# Patient Record
Sex: Female | Born: 1990 | Race: Asian | Hispanic: No | Marital: Single | Smoking: Former smoker
Health system: Southern US, Community
[De-identification: ages and names within clinical notes are randomized; demographics above are authoritative.]

## PROBLEM LIST (undated history)

## (undated) DIAGNOSIS — Z789 Other specified health status: Secondary | ICD-10-CM

## (undated) HISTORY — DX: Other specified health status: Z78.9

## (undated) HISTORY — PX: WISDOM TOOTH EXTRACTION: SHX21

## (undated) HISTORY — PX: NO PAST SURGERIES: SHX2092

---

## 2010-07-13 ENCOUNTER — Emergency Department (HOSPITAL_COMMUNITY)
Admission: EM | Admit: 2010-07-13 | Discharge: 2010-07-13 | Disposition: A | Discharge status: Home / Self Care | Payer: Self-pay | Attending: Emergency Medicine | Admitting: Emergency Medicine

## 2010-07-13 ENCOUNTER — Emergency Department (HOSPITAL_COMMUNITY): Payer: Self-pay

## 2010-07-13 DIAGNOSIS — S0100XA Unspecified open wound of scalp, initial encounter: Secondary | ICD-10-CM | POA: Insufficient documentation

## 2010-07-13 DIAGNOSIS — Z23 Encounter for immunization: Secondary | ICD-10-CM | POA: Insufficient documentation

## 2010-07-13 DIAGNOSIS — S0083XA Contusion of other part of head, initial encounter: Secondary | ICD-10-CM | POA: Insufficient documentation

## 2010-07-13 DIAGNOSIS — S0003XA Contusion of scalp, initial encounter: Secondary | ICD-10-CM | POA: Insufficient documentation

## 2010-07-13 DIAGNOSIS — Y9229 Other specified public building as the place of occurrence of the external cause: Secondary | ICD-10-CM | POA: Insufficient documentation

## 2010-07-13 LAB — GLUCOSE, CAPILLARY: Glucose-Capillary: 100 mg/dL — ABNORMAL HIGH (ref 70–99)

## 2010-07-13 LAB — POCT I-STAT, CHEM 8
Calcium, Ion: 1.04 mmol/L — ABNORMAL LOW (ref 1.12–1.32)
Glucose, Bld: 95 mg/dL (ref 70–99)
HCT: 43 % (ref 36.0–46.0)
Hemoglobin: 14.6 g/dL (ref 12.0–15.0)
TCO2: 24 mmol/L (ref 0–100)
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
## 2013-12-23 ENCOUNTER — Ambulatory Visit (INDEPENDENT_AMBULATORY_CARE_PROVIDER_SITE_OTHER): Payer: Medicaid Other | Admitting: Adult Health

## 2013-12-23 ENCOUNTER — Encounter: Payer: Self-pay | Admitting: Adult Health

## 2013-12-23 VITALS — BP 108/48 | Ht 66.0 in | Wt 131.0 lb

## 2013-12-23 DIAGNOSIS — Z331 Pregnant state, incidental: Secondary | ICD-10-CM

## 2013-12-23 DIAGNOSIS — Z3490 Encounter for supervision of normal pregnancy, unspecified, unspecified trimester: Secondary | ICD-10-CM

## 2013-12-23 DIAGNOSIS — Z3201 Encounter for pregnancy test, result positive: Secondary | ICD-10-CM

## 2013-12-23 LAB — POCT URINE PREGNANCY: PREG TEST UR: POSITIVE

## 2013-12-23 MED ORDER — PRENATAL PLUS 27-1 MG PO TABS: 1.0000 | ORAL_TABLET | Freq: Every day | ORAL | Status: DC

## 2013-12-23 NOTE — Progress Notes (Signed)
Subjective:     Patient ID: Tina GatesMary Kent, female   DOB: 1990-07-24, 23 y.o.   MRN: [id number]  HPI Tina DandyMary is a 23 year old female in for a UPT.Has noticed some stomach pressure, no pain.  Review of Systems See HPI   Reviewed past medical,surgical, social and family history. Reviewed medications and allergies.  Objective:   Physical Exam BP 108/48 mmHg  Ht 5\' 6"  (1.676 m)  Wt 131 lb (59.421 kg)  BMI 21.15 kg/m2  LMP 09/25/2015UPT +, pt happy about positive test, taking gummies but wants prenatal Rx. She is about 7 weeks by LMP with EDD 08/12/14.    Assessment:     Pregnant +UPT    Plan:     Return in 1 week for dating US  Rx prenatal plus #30 1 daily with 11 refills Review handout on first trimester

## 2013-12-23 NOTE — Patient Instructions (Signed)
First Trimester of Pregnancy The first trimester of pregnancy is from week 1 until the end of week 12 (months 1 through 3). A week after a sperm fertilizes an egg, the egg will implant on the wall of the uterus. This embryo will begin to develop into a baby. Genes from you and your partner are forming the baby. The female genes determine whether the baby is a boy or a girl. At 6-8 weeks, the eyes and face are formed, and the heartbeat can be seen on ultrasound. At the end of 12 weeks, all the baby's organs are formed.  Now that you are pregnant, you will want to do everything you can to have a healthy baby. Two of the most important things are to get good prenatal care and to follow your health care provider's instructions. Prenatal care is all the medical care you receive before the baby's birth. This care will help prevent, find, and treat any problems during the pregnancy and childbirth. BODY CHANGES Your body goes through many changes during pregnancy. The changes vary from woman to woman.   You may gain or lose a couple of pounds at first.  You may feel sick to your stomach (nauseous) and throw up (vomit). If the vomiting is uncontrollable, call your health care provider.  You may tire easily.  You may develop headaches that can be relieved by medicines approved by your health care provider.  You may urinate more often. Painful urination may mean you have a bladder infection.  You may develop heartburn as a result of your pregnancy.  You may develop constipation because certain hormones are causing the muscles that push waste through your intestines to slow down.  You may develop hemorrhoids or swollen, bulging veins (varicose veins).  Your breasts may begin to grow larger and become tender. Your nipples may stick out more, and the tissue that surrounds them (areola) may become darker.  Your gums may bleed and may be sensitive to brushing and flossing.  Dark spots or blotches (chloasma,  mask of pregnancy) may develop on your face. This will likely fade after the baby is born.  Your menstrual periods will stop.  You may have a loss of appetite.  You may develop cravings for certain kinds of food.  You may have changes in your emotions from day to day, such as being excited to be pregnant or being concerned that something may go wrong with the pregnancy and baby.  You may have more vivid and strange dreams.  You may have changes in your hair. These can include thickening of your hair, rapid growth, and changes in texture. Some women also have hair loss during or after pregnancy, or hair that feels dry or thin. Your hair will most likely return to normal after your baby is born. WHAT TO EXPECT AT YOUR PRENATAL VISITS During a routine prenatal visit:  You will be weighed to make sure you and the baby are growing normally.  Your blood pressure will be taken.  Your abdomen will be measured to track your baby's growth.  The fetal heartbeat will be listened to starting around week 10 or 12 of your pregnancy.  Test results from any previous visits will be discussed. Your health care provider may ask you:  How you are feeling.  If you are feeling the baby move.  If you have had any abnormal symptoms, such as leaking fluid, bleeding, severe headaches, or abdominal cramping.  If you have any questions. Other tests   that may be performed during your first trimester include:  Blood tests to find your blood type and to check for the presence of any previous infections. They will also be used to check for low iron levels (anemia) and Rh antibodies. Later in the pregnancy, blood tests for diabetes will be done along with other tests if problems develop.  Urine tests to check for infections, diabetes, or protein in the urine.  An ultrasound to confirm the proper growth and development of the baby.  An amniocentesis to check for possible genetic problems.  Fetal screens for  spina bifida and Down syndrome.  You may need other tests to make sure you and the baby are doing well. HOME CARE INSTRUCTIONS  Medicines  Follow your health care provider's instructions regarding medicine use. Specific medicines may be either safe or unsafe to take during pregnancy.  Take your prenatal vitamins as directed.  If you develop constipation, try taking a stool softener if your health care provider approves. Diet  Eat regular, well-balanced meals. Choose a variety of foods, such as meat or vegetable-based protein, fish, milk and low-fat dairy products, vegetables, fruits, and whole grain breads and cereals. Your health care provider will help you determine the amount of weight gain that is right for you.  Avoid raw meat and uncooked cheese. These carry germs that can cause birth defects in the baby.  Eating four or five small meals rather than three large meals a day may help relieve nausea and vomiting. If you start to feel nauseous, eating a few soda crackers can be helpful. Drinking liquids between meals instead of during meals also seems to help nausea and vomiting.  If you develop constipation, eat more high-fiber foods, such as fresh vegetables or fruit and whole grains. Drink enough fluids to keep your urine clear or pale yellow. Activity and Exercise  Exercise only as directed by your health care provider. Exercising will help you:  Control your weight.  Stay in shape.  Be prepared for labor and delivery.  Experiencing pain or cramping in the lower abdomen or low back is a good sign that you should stop exercising. Check with your health care provider before continuing normal exercises.  Try to avoid standing for long periods of time. Move your legs often if you must stand in one place for a long time.  Avoid heavy lifting.  Wear low-heeled shoes, and practice good posture.  You may continue to have sex unless your health care provider directs you  otherwise. Relief of Pain or Discomfort  Wear a good support bra for breast tenderness.   Take warm sitz baths to soothe any pain or discomfort caused by hemorrhoids. Use hemorrhoid cream if your health care provider approves.   Rest with your legs elevated if you have leg cramps or low back pain.  If you develop varicose veins in your legs, wear support hose. Elevate your feet for 15 minutes, 3-4 times a day. Limit salt in your diet. Prenatal Care  Schedule your prenatal visits by the twelfth week of pregnancy. They are usually scheduled monthly at first, then more often in the last 2 months before delivery.  Write down your questions. Take them to your prenatal visits.  Keep all your prenatal visits as directed by your health care provider. Safety  Wear your seat belt at all times when driving.  Make a list of emergency phone numbers, including numbers for family, friends, the hospital, and police and fire departments. General Tips    Ask your health care provider for a referral to a local prenatal education class. Begin classes no later than at the beginning of month 6 of your pregnancy.  Ask for help if you have counseling or nutritional needs during pregnancy. Your health care provider can offer advice or refer you to specialists for help with various needs.  Do not use hot tubs, steam rooms, or saunas.  Do not douche or use tampons or scented sanitary pads.  Do not cross your legs for long periods of time.  Avoid cat litter boxes and soil used by cats. These carry germs that can cause birth defects in the baby and possibly loss of the fetus by miscarriage or stillbirth.  Avoid all smoking, herbs, alcohol, and medicines not prescribed by your health care provider. Chemicals in these affect the formation and growth of the baby.  Schedule a dentist appointment. At home, brush your teeth with a soft toothbrush and be gentle when you floss. SEEK MEDICAL CARE IF:   You have  dizziness.  You have mild pelvic cramps, pelvic pressure, or nagging pain in the abdominal area.  You have persistent nausea, vomiting, or diarrhea.  You have a bad smelling vaginal discharge.  You have pain with urination.  You notice increased swelling in your face, hands, legs, or ankles. SEEK IMMEDIATE MEDICAL CARE IF:   You have a fever.  You are leaking fluid from your vagina.  You have spotting or bleeding from your vagina.  You have severe abdominal cramping or pain.  You have rapid weight gain or loss.  You vomit blood or material that looks like coffee grounds.  You are exposed to German measles and have never had them.  You are exposed to fifth disease or chickenpox.  You develop a severe headache.  You have shortness of breath.  You have any kind of trauma, such as from a fall or a car accident. Document Released: 01/21/2001 Document Revised: 06/13/2013 Document Reviewed: 12/07/2012 Exit[employer] Patient Information 2015 [employer]. This information is not intended to replace advice given to you by your health care provider. Make sure you discuss any questions you have with your health care provider. Return in 1 week for dating US 

## 2013-12-30 ENCOUNTER — Ambulatory Visit (INDEPENDENT_AMBULATORY_CARE_PROVIDER_SITE_OTHER): Payer: Medicaid Other

## 2013-12-30 ENCOUNTER — Other Ambulatory Visit: Payer: Self-pay | Admitting: Adult Health

## 2013-12-30 DIAGNOSIS — O3680X0 Pregnancy with inconclusive fetal viability, not applicable or unspecified: Secondary | ICD-10-CM

## 2013-12-30 DIAGNOSIS — Z3490 Encounter for supervision of normal pregnancy, unspecified, unspecified trimester: Secondary | ICD-10-CM

## 2013-12-30 NOTE — Progress Notes (Signed)
U/S(8+0wks)-single fetus, CRL c/w LMP dates, FHR-165 bpm, cx appears closed, bilateral adnexa appears WNL

## 2014-01-16 ENCOUNTER — Encounter: Payer: Self-pay | Admitting: Women's Health

## 2014-01-16 ENCOUNTER — Ambulatory Visit (INDEPENDENT_AMBULATORY_CARE_PROVIDER_SITE_OTHER): Payer: Medicaid Other | Admitting: Women's Health

## 2014-01-16 VITALS — BP 118/58 | Wt 132.0 lb

## 2014-01-16 DIAGNOSIS — Z0283 Encounter for blood-alcohol and blood-drug test: Secondary | ICD-10-CM

## 2014-01-16 DIAGNOSIS — Z113 Encounter for screening for infections with a predominantly sexual mode of transmission: Secondary | ICD-10-CM

## 2014-01-16 DIAGNOSIS — Z1389 Encounter for screening for other disorder: Secondary | ICD-10-CM

## 2014-01-16 DIAGNOSIS — Z3682 Encounter for antenatal screening for nuchal translucency: Secondary | ICD-10-CM

## 2014-01-16 DIAGNOSIS — Z130 Encounter for screening for diseases of the blood and blood-forming organs and certain disorders involving the immune mechanism: Secondary | ICD-10-CM

## 2014-01-16 DIAGNOSIS — Z1159 Encounter for screening for other viral diseases: Secondary | ICD-10-CM

## 2014-01-16 DIAGNOSIS — Z1371 Encounter for nonprocreative screening for genetic disease carrier status: Secondary | ICD-10-CM

## 2014-01-16 DIAGNOSIS — Z3400 Encounter for supervision of normal first pregnancy, unspecified trimester: Secondary | ICD-10-CM | POA: Insufficient documentation

## 2014-01-16 DIAGNOSIS — Z114 Encounter for screening for human immunodeficiency virus [HIV]: Secondary | ICD-10-CM

## 2014-01-16 DIAGNOSIS — Z3401 Encounter for supervision of normal first pregnancy, first trimester: Secondary | ICD-10-CM

## 2014-01-16 DIAGNOSIS — Z0184 Encounter for antibody response examination: Secondary | ICD-10-CM

## 2014-01-16 DIAGNOSIS — Z331 Pregnant state, incidental: Secondary | ICD-10-CM

## 2014-01-16 DIAGNOSIS — Z118 Encounter for screening for other infectious and parasitic diseases: Secondary | ICD-10-CM

## 2014-01-16 LAB — POCT URINALYSIS DIPSTICK
GLUCOSE UA: NEGATIVE
Ketones, UA: NEGATIVE
LEUKOCYTES UA: NEGATIVE
NITRITE UA: NEGATIVE

## 2014-01-16 LAB — CBC
HCT: 34.7 % — ABNORMAL LOW (ref 36.0–46.0)
Hemoglobin: 11.6 g/dL — ABNORMAL LOW (ref 12.0–15.0)
MCH: 28.6 pg (ref 26.0–34.0)
MCHC: 33.4 g/dL (ref 30.0–36.0)
MCV: 85.5 fL (ref 78.0–100.0)
MPV: 10.3 fL (ref 9.4–12.4)
PLATELETS: 244 10*3/uL (ref 150–400)
RBC: 4.06 MIL/uL (ref 3.87–5.11)
RDW: 13.6 % (ref 11.5–15.5)
WBC: 8.8 10*3/uL (ref 4.0–10.5)

## 2014-01-16 NOTE — Patient Instructions (Signed)

## 2014-01-16 NOTE — Progress Notes (Signed)
  Subjective:  Tina Kent is a 23 y.o. G1P0 Asian female at 3376w3d by LMP c/w 7wk u/s, being seen today for her first obstetrical visit.  Her obstetrical history is significant for primigravida.  Pregnancy history fully reviewed.  Patient reports no complaints. Denies vb, cramping, uti s/s, abnormal/malodorous vag d/c, or vulvovaginal itching/irritation.  BP 118/58 mmHg  Wt 132 lb (59.875 kg)  LMP 11/04/2013  HISTORY: OB History  Gravida Para Term Preterm AB SAB TAB Ectopic Multiple Living  1             # Outcome Date GA Lbr Len/2nd Weight Sex Delivery Anes PTL Lv  1 Current              Past Medical History  Diagnosis Date  . Medical history non-contributory    Past Surgical History  Procedure Laterality Date  . No past surgeries     History reviewed. No pertinent family history.  Exam   System:     General: Well developed & nourished, no acute distress   Skin: Warm & dry, normal coloration and turgor, no rashes   Neurologic: Alert & oriented, normal mood   Cardiovascular: Regular rate & rhythm   Respiratory: Effort & rate normal, LCTAB, acyanotic   Abdomen: Soft, non tender   Extremities: normal strength, tone   Thin prep pap smear: [facility]- not sure when FHR: 166 via doppler   Assessment:   Pregnancy: G1P0 Patient Active Problem List   Diagnosis Date Noted  . Supervision of normal first pregnancy 01/16/2014    Priority: High    6576w3d G1P0 New OB visit    Plan:  Initial labs drawn Continue prenatal vitamins Problem list reviewed and updated Reviewed n/v relief measures and warning s/s to report Reviewed recommended weight gain based on pre-gravid BMI Encouraged well-balanced diet Genetic Screening discussed Integrated Screen: requested Cystic fibrosis screening discussed requested Ultrasound discussed; fetal survey: requested Follow up in 2 weeks for 1st it/nt and visit [facility] completed NFP offered, wants to think about  it [facility] if [facility]- do pap at next visit  Marge DuncansBooker, Tameaka Eichhorn Randall CNM, [facility] 01/16/2014 9:59 AM

## 2014-01-17 ENCOUNTER — Encounter: Payer: Self-pay | Admitting: Women's Health

## 2014-01-17 DIAGNOSIS — F1290 Cannabis use, unspecified, uncomplicated: Secondary | ICD-10-CM | POA: Insufficient documentation

## 2014-01-17 LAB — ABO AND RH: Rh Type: POSITIVE

## 2014-01-17 LAB — URINALYSIS, ROUTINE W REFLEX MICROSCOPIC
Bilirubin Urine: NEGATIVE
Glucose, UA: NEGATIVE mg/dL
HGB URINE DIPSTICK: NEGATIVE
Ketones, ur: NEGATIVE mg/dL
LEUKOCYTES UA: NEGATIVE
NITRITE: NEGATIVE
PROTEIN: NEGATIVE mg/dL
Specific Gravity, Urine: 1.019 (ref 1.005–1.030)
UROBILINOGEN UA: 0.2 mg/dL (ref 0.0–1.0)
pH: 6 (ref 5.0–8.0)

## 2014-01-17 LAB — DRUG SCREEN, URINE, NO CONFIRMATION
Amphetamine Screen, Ur: NEGATIVE
BENZODIAZEPINES.: NEGATIVE
Barbiturate Quant, Ur: NEGATIVE
CREATININE, U: 262.9 mg/dL
Cocaine Metabolites: NEGATIVE
METHADONE: NEGATIVE
Marijuana Metabolite: POSITIVE — AB
Opiate Screen, Urine: NEGATIVE
PHENCYCLIDINE (PCP): NEGATIVE
PROPOXYPHENE: NEGATIVE

## 2014-01-17 LAB — URINE CULTURE
COLONY COUNT: NO GROWTH
Organism ID, Bacteria: NO GROWTH

## 2014-01-17 LAB — CYSTIC FIBROSIS DIAGNOSTIC STUDY

## 2014-01-17 LAB — RPR

## 2014-01-17 LAB — HEPATITIS B SURFACE ANTIGEN: Hepatitis B Surface Ag: NEGATIVE

## 2014-01-17 LAB — HIV ANTIBODY (ROUTINE TESTING W REFLEX): HIV: NONREACTIVE

## 2014-01-17 LAB — RUBELLA SCREEN: Rubella: 1.97 Index — ABNORMAL HIGH (ref <0.90)

## 2014-01-17 LAB — ANTIBODY SCREEN: Antibody Screen: NEGATIVE

## 2014-01-17 LAB — GC/CHLAMYDIA PROBE AMP
CT Probe RNA: POSITIVE — AB
GC PROBE AMP APTIMA: NEGATIVE

## 2014-01-17 LAB — OXYCODONE SCREEN, UA, RFLX CONFIRM: OXYCODONE SCRN UR: NEGATIVE ng/mL

## 2014-01-17 LAB — VARICELLA ZOSTER ANTIBODY, IGG: Varicella IgG: 1602 Index — ABNORMAL HIGH (ref <135.00)

## 2014-01-17 LAB — SICKLE CELL SCREEN: SICKLE CELL SCREEN: NEGATIVE

## 2014-01-18 ENCOUNTER — Encounter: Payer: Self-pay | Admitting: Women's Health

## 2014-01-18 ENCOUNTER — Telehealth: Payer: Self-pay | Admitting: Women's Health

## 2014-01-18 DIAGNOSIS — O98811 Other maternal infectious and parasitic diseases complicating pregnancy, first trimester: Secondary | ICD-10-CM

## 2014-01-18 DIAGNOSIS — A749 Chlamydial infection, unspecified: Secondary | ICD-10-CM | POA: Insufficient documentation

## 2014-01-18 MED ORDER — AZITHROMYCIN 500 MG PO TABS: 1000.0000 mg | ORAL_TABLET | Freq: Once | ORAL | Status: DC

## 2014-01-18 NOTE — Telephone Encounter (Signed)
Attempted to contact pt to notify of +CT, number is temporarily disconnected, and no other number given, so will send certified letter.  Cheral MarkerKimberly R. Inioluwa Baris, CNM, [facility] 01/18/2014 10:56 AM

## 2014-01-30 ENCOUNTER — Encounter: Payer: Self-pay | Admitting: Women's Health

## 2014-01-30 ENCOUNTER — Ambulatory Visit (INDEPENDENT_AMBULATORY_CARE_PROVIDER_SITE_OTHER): Payer: Medicaid Other | Admitting: Women's Health

## 2014-01-30 ENCOUNTER — Ambulatory Visit (INDEPENDENT_AMBULATORY_CARE_PROVIDER_SITE_OTHER): Payer: Medicaid Other

## 2014-01-30 VITALS — BP 110/70 | Wt 132.0 lb

## 2014-01-30 DIAGNOSIS — F1290 Cannabis use, unspecified, uncomplicated: Secondary | ICD-10-CM

## 2014-01-30 DIAGNOSIS — O98811 Other maternal infectious and parasitic diseases complicating pregnancy, first trimester: Secondary | ICD-10-CM

## 2014-01-30 DIAGNOSIS — Z3682 Encounter for antenatal screening for nuchal translucency: Secondary | ICD-10-CM

## 2014-01-30 DIAGNOSIS — Z36 Encounter for antenatal screening of mother: Secondary | ICD-10-CM

## 2014-01-30 DIAGNOSIS — Z331 Pregnant state, incidental: Secondary | ICD-10-CM

## 2014-01-30 DIAGNOSIS — Z3401 Encounter for supervision of normal first pregnancy, first trimester: Secondary | ICD-10-CM

## 2014-01-30 DIAGNOSIS — Z1389 Encounter for screening for other disorder: Secondary | ICD-10-CM

## 2014-01-30 DIAGNOSIS — A749 Chlamydial infection, unspecified: Secondary | ICD-10-CM

## 2014-01-30 LAB — POCT URINALYSIS DIPSTICK
GLUCOSE UA: NEGATIVE
KETONES UA: NEGATIVE
Leukocytes, UA: NEGATIVE
Nitrite, UA: NEGATIVE
Protein, UA: NEGATIVE
RBC UA: NEGATIVE

## 2014-01-30 NOTE — Progress Notes (Signed)
U/S(12+3wks)-active fetus, CRL c/w dates, fluid wnl, anterior Gr 0 placenta, cx appears closed (3.9cm), bilateral adnexa appears WNL, NB present, NT-1.7872mm, FHR-162 bpm

## 2014-01-30 NOTE — Progress Notes (Signed)
Low-risk OB appointment G1P0 6313w3d Estimated Date of Delivery: 08/11/14 BP 110/70 mmHg  Wt 132 lb (59.875 kg)  LMP 11/04/2013  BP, weight, and urine reviewed.  Refer to obstetrical flow sheet for FH & FHR.  No fm yet. Denies cramping, lof, vb, or uti s/s. No complaints. Got certified letter for +CT, she & partner took meds 12/16. Phone is now back on. Last THC ~1.385months ago, will retest later.  Reviewed today's normal NT u/s, warning s/s to report. No sex x 7days from both taking meds, will test poc at next visit w/ pap.  Plan:  Continue routine obstetrical care  F/U in 4wks for OB appointment, pap & CT POC 1st it/nt today

## 2014-01-30 NOTE — Patient Instructions (Signed)
Second Trimester of Pregnancy The second trimester is from week 13 through week 28, months 4 through 6. The second trimester is often a time when you feel your best. Your body has also adjusted to being pregnant, and you begin to feel better physically. Usually, morning sickness has lessened or quit completely, you may have more energy, and you may have an increase in appetite. The second trimester is also a time when the fetus is growing rapidly. At the end of the sixth month, the fetus is about 9 inches long and weighs about 1 pounds. You will likely begin to feel the baby move (quickening) between 18 and 20 weeks of the pregnancy. BODY CHANGES Your body goes through many changes during pregnancy. The changes vary from woman to woman.   Your weight will continue to increase. You will notice your lower abdomen bulging out.  You may begin to get stretch marks on your hips, abdomen, and breasts.  You may develop headaches that can be relieved by medicines approved by your health care provider.  You may urinate more often because the fetus is pressing on your bladder.  You may develop or continue to have heartburn as a result of your pregnancy.  You may develop constipation because certain hormones are causing the muscles that push waste through your intestines to slow down.  You may develop hemorrhoids or swollen, bulging veins (varicose veins).  You may have back pain because of the weight gain and pregnancy hormones relaxing your joints between the bones in your pelvis and as a result of a shift in weight and the muscles that support your balance.  Your breasts will continue to grow and be tender.  Your gums may bleed and may be sensitive to brushing and flossing.  Dark spots or blotches (chloasma, mask of pregnancy) may develop on your face. This will likely fade after the baby is born.  A dark line from your belly button to the pubic area (linea nigra) may appear. This will likely fade  after the baby is born.  You may have changes in your hair. These can include thickening of your hair, rapid growth, and changes in texture. Some women also have hair loss during or after pregnancy, or hair that feels dry or thin. Your hair will most likely return to normal after your baby is born. WHAT TO EXPECT AT YOUR PRENATAL VISITS During a routine prenatal visit:  You will be weighed to make sure you and the fetus are growing normally.  Your blood pressure will be taken.  Your abdomen will be measured to track your baby's growth.  The fetal heartbeat will be listened to.  Any test results from the previous visit will be discussed. Your health care provider may ask you:  How you are feeling.  If you are feeling the baby move.  If you have had any abnormal symptoms, such as leaking fluid, bleeding, severe headaches, or abdominal cramping.  If you have any questions. Other tests that may be performed during your second trimester include:  Blood tests that check for:  Low iron levels (anemia).  Gestational diabetes (between 24 and 28 weeks).  Rh antibodies.  Urine tests to check for infections, diabetes, or protein in the urine.  An ultrasound to confirm the proper growth and development of the baby.  An amniocentesis to check for possible genetic problems.  Fetal screens for spina bifida and Down syndrome. HOME CARE INSTRUCTIONS   Avoid all smoking, herbs, alcohol, and unprescribed   drugs. These chemicals affect the formation and growth of the baby.  Follow your health care provider's instructions regarding medicine use. There are medicines that are either safe or unsafe to take during pregnancy.  Exercise only as directed by your health care provider. Experiencing uterine cramps is a good sign to stop exercising.  Continue to eat regular, healthy meals.  Wear a good support bra for breast tenderness.  Do not use hot tubs, steam rooms, or saunas.  Wear your  seat belt at all times when driving.  Avoid raw meat, uncooked cheese, cat litter boxes, and soil used by cats. These carry germs that can cause birth defects in the baby.  Take your prenatal vitamins.  Try taking a stool softener (if your health care provider approves) if you develop constipation. Eat more high-fiber foods, such as fresh vegetables or fruit and whole grains. Drink plenty of fluids to keep your urine clear or pale yellow.  Take warm sitz baths to soothe any pain or discomfort caused by hemorrhoids. Use hemorrhoid cream if your health care provider approves.  If you develop varicose veins, wear support hose. Elevate your feet for 15 minutes, 3-4 times a day. Limit salt in your diet.  Avoid heavy lifting, wear low heel shoes, and practice good posture.  Rest with your legs elevated if you have leg cramps or low back pain.  Visit your dentist if you have not gone yet during your pregnancy. Use a soft toothbrush to brush your teeth and be gentle when you floss.  A sexual relationship may be continued unless your health care provider directs you otherwise.  Continue to go to all your prenatal visits as directed by your health care provider. SEEK MEDICAL CARE IF:   You have dizziness.  You have mild pelvic cramps, pelvic pressure, or nagging pain in the abdominal area.  You have persistent nausea, vomiting, or diarrhea.  You have a bad smelling vaginal discharge.  You have pain with urination. SEEK IMMEDIATE MEDICAL CARE IF:   You have a fever.  You are leaking fluid from your vagina.  You have spotting or bleeding from your vagina.  You have severe abdominal cramping or pain.  You have rapid weight gain or loss.  You have shortness of breath with chest pain.  You notice sudden or extreme swelling of your face, hands, ankles, feet, or legs.  You have not felt your baby move in over an hour.  You have severe headaches that do not go away with  medicine.  You have vision changes. Document Released: 01/21/2001 Document Revised: 02/01/2013 Document Reviewed: 03/30/2012 ExitCare Patient Information 2015 [employer]. This information is not intended to replace advice given to you by your health care provider. Make sure you discuss any questions you have with your health care provider.  

## 2014-02-04 LAB — MATERNAL SCREEN, INTEGRATED #1

## 2014-02-10 NOTE — L&D Delivery Note (Signed)
Patient is 24 y.o. G1P1001 [redacted]w[redacted]d admitted in active labor, hx of chlamydia and anemia, +UDS THC in December   Delivery Note At 8:39 PM a viable female was delivered via Vaginal, Spontaneous Delivery (Presentation: Right Occiput Anterior).  APGAR: 9, 9; weight 8 lb 0.4 oz (3640 g).   Placenta status: Intact, Spontaneous.  Cord: 3 vessels with the following complications: None.  Anesthesia: Epidural  Episiotomy: None Lacerations:  1st degree, right periurethral that had pulsating bleeder, hemostatic after repair Suture Repair: 3.0 vicryl rapide for second degree, 4.0 monocryl for right periurethral Est. Blood Loss (mL): 340  Mom to postpartum.  Baby to Couplet care / Skin to Skin.  Ruthellen Tippy ROCIO 08/05/2014, 10:07 PM

## 2014-02-27 ENCOUNTER — Ambulatory Visit (INDEPENDENT_AMBULATORY_CARE_PROVIDER_SITE_OTHER): Payer: Medicaid Other | Admitting: Women's Health

## 2014-02-27 ENCOUNTER — Other Ambulatory Visit (HOSPITAL_COMMUNITY)
Admission: RE | Admit: 2014-02-27 | Discharge: 2014-02-27 | Disposition: A | Discharge status: Home / Self Care | Payer: Medicaid Other | Source: Ambulatory Visit | Attending: Obstetrics & Gynecology | Admitting: Obstetrics & Gynecology

## 2014-02-27 ENCOUNTER — Encounter: Payer: Self-pay | Admitting: Women's Health

## 2014-02-27 VITALS — BP 100/54 | Wt 136.0 lb

## 2014-02-27 DIAGNOSIS — Z01419 Encounter for gynecological examination (general) (routine) without abnormal findings: Secondary | ICD-10-CM | POA: Insufficient documentation

## 2014-02-27 DIAGNOSIS — Z124 Encounter for screening for malignant neoplasm of cervix: Secondary | ICD-10-CM

## 2014-02-27 DIAGNOSIS — Z113 Encounter for screening for infections with a predominantly sexual mode of transmission: Secondary | ICD-10-CM | POA: Diagnosis present

## 2014-02-27 DIAGNOSIS — Z3402 Encounter for supervision of normal first pregnancy, second trimester: Secondary | ICD-10-CM

## 2014-02-27 DIAGNOSIS — Z3682 Encounter for antenatal screening for nuchal translucency: Secondary | ICD-10-CM

## 2014-02-27 DIAGNOSIS — Z1389 Encounter for screening for other disorder: Secondary | ICD-10-CM

## 2014-02-27 DIAGNOSIS — Z331 Pregnant state, incidental: Secondary | ICD-10-CM

## 2014-02-27 DIAGNOSIS — Z363 Encounter for antenatal screening for malformations: Secondary | ICD-10-CM

## 2014-02-27 LAB — POCT URINALYSIS DIPSTICK
Blood, UA: NEGATIVE
GLUCOSE UA: NEGATIVE
Ketones, UA: NEGATIVE
Leukocytes, UA: NEGATIVE
NITRITE UA: NEGATIVE
Protein, UA: NEGATIVE

## 2014-02-27 NOTE — Progress Notes (Signed)
Low-risk OB appointment G1P0 170w3d Estimated Date of Delivery: 08/11/14 BP 100/54 mmHg  Wt 136 lb (61.689 kg)  LMP 11/04/2013  BP, weight, and urine reviewed.  Refer to obstetrical flow sheet for FH & FHR.  No fm yet. Denies cramping, lof, vb, or uti s/s. Itchy rash Lt arm, Lt thigh, abd- no one else in home w/ it. Fine red rash- can try hydrocortisone. LBP, discussed relief measures.  Thin prep pap obtained, cx closed, will also do ct poc.  Reviewed warning s/s to report. Plan:  Continue routine obstetrical care  F/U in 4wks for OB appointment and anatomy u/s 2nd IT today

## 2014-02-27 NOTE — Patient Instructions (Signed)
For your lower back pain you may:  Purchase a pregnancy belt from [employer], [employer], [employer], etc and wear it while you are up and about  Take warm baths  Use a heating pad to your lower back for no longer than 20 minutes at a time, and do not place near abdomen  Take tylenol as needed. Please follow directions on the bottle   Second Trimester of Pregnancy The second trimester is from week 13 through week 28, months 4 through 6. The second trimester is often a time when you feel your best. Your body has also adjusted to being pregnant, and you begin to feel better physically. Usually, morning sickness has lessened or quit completely, you may have more energy, and you may have an increase in appetite. The second trimester is also a time when the fetus is growing rapidly. At the end of the sixth month, the fetus is about 9 inches long and weighs about 1 pounds. You will likely begin to feel the baby move (quickening) between 18 and 20 weeks of the pregnancy. BODY CHANGES Your body goes through many changes during pregnancy. The changes vary from woman to woman.   Your weight will continue to increase. You will notice your lower abdomen bulging out.  You may begin to get stretch marks on your hips, abdomen, and breasts.  You may develop headaches that can be relieved by medicines approved by your health care provider.  You may urinate more often because the fetus is pressing on your bladder.  You may develop or continue to have heartburn as a result of your pregnancy.  You may develop constipation because certain hormones are causing the muscles that push waste through your intestines to slow down.  You may develop hemorrhoids or swollen, bulging veins (varicose veins).  You may have back pain because of the weight gain and pregnancy hormones relaxing your joints between the bones in your pelvis and as a result of a shift in weight and the muscles that support your  balance.  Your breasts will continue to grow and be tender.  Your gums may bleed and may be sensitive to brushing and flossing.  Dark spots or blotches (chloasma, mask of pregnancy) may develop on your face. This will likely fade after the baby is born.  A dark line from your belly button to the pubic area (linea nigra) may appear. This will likely fade after the baby is born.  You may have changes in your hair. These can include thickening of your hair, rapid growth, and changes in texture. Some women also have hair loss during or after pregnancy, or hair that feels dry or thin. Your hair will most likely return to normal after your baby is born. WHAT TO EXPECT AT YOUR PRENATAL VISITS During a routine prenatal visit:  You will be weighed to make sure you and the fetus are growing normally.  Your blood pressure will be taken.  Your abdomen will be measured to track your baby's growth.  The fetal heartbeat will be listened to.  Any test results from the previous visit will be discussed. Your health care provider may ask you:  How you are feeling.  If you are feeling the baby move.  If you have had any abnormal symptoms, such as leaking fluid, bleeding, severe headaches, or abdominal cramping.  If you have any questions. Other tests that may be performed during your second trimester include:  Blood tests that check for:  Low  Low iron levels (anemia).  Gestational diabetes (between 24 and 28 weeks).  Rh antibodies.  Urine tests to check for infections, diabetes, or protein in the urine.  An ultrasound to confirm the proper growth and development of the baby.  An amniocentesis to check for possible genetic problems.  Fetal screens for spina bifida and Down syndrome. HOME CARE INSTRUCTIONS   Avoid all smoking, herbs, alcohol, and unprescribed drugs. These chemicals affect the formation and growth of the baby.  Follow your health care provider's instructions regarding  medicine use. There are medicines that are either safe or unsafe to take during pregnancy.  Exercise only as directed by your health care provider. Experiencing uterine cramps is a good sign to stop exercising.  Continue to eat regular, healthy meals.  Wear a good support bra for breast tenderness.  Do not use hot tubs, steam rooms, or saunas.  Wear your seat belt at all times when driving.  Avoid raw meat, uncooked cheese, cat litter boxes, and soil used by cats. These carry germs that can cause birth defects in the baby.  Take your prenatal vitamins.  Try taking a stool softener (if your health care provider approves) if you develop constipation. Eat more high-fiber foods, such as fresh vegetables or fruit and whole grains. Drink plenty of fluids to keep your urine clear or pale yellow.  Take warm sitz baths to soothe any pain or discomfort caused by hemorrhoids. Use hemorrhoid cream if your health care provider approves.  If you develop varicose veins, wear support hose. Elevate your feet for 15 minutes, 3-4 times a day. Limit salt in your diet.  Avoid heavy lifting, wear low heel shoes, and practice good posture.  Rest with your legs elevated if you have leg cramps or low back pain.  Visit your dentist if you have not gone yet during your pregnancy. Use a soft toothbrush to brush your teeth and be gentle when you floss.  A sexual relationship may be continued unless your health care provider directs you otherwise.  Continue to go to all your prenatal visits as directed by your health care provider. SEEK MEDICAL CARE IF:   You have dizziness.  You have mild pelvic cramps, pelvic pressure, or nagging pain in the abdominal area.  You have persistent nausea, vomiting, or diarrhea.  You have a bad smelling vaginal discharge.  You have pain with urination. SEEK IMMEDIATE MEDICAL CARE IF:   You have a fever.  You are leaking fluid from your vagina.  You have spotting or  bleeding from your vagina.  You have severe abdominal cramping or pain.  You have rapid weight gain or loss.  You have shortness of breath with chest pain.  You notice sudden or extreme swelling of your face, hands, ankles, feet, or legs.  You have not felt your baby move in over an hour.  You have severe headaches that do not go away with medicine.  You have vision changes. Document Released: 01/21/2001 Document Revised: 02/01/2013 Document Reviewed: 03/30/2012 [employer] Patient Information 2015 [employer]. This information is not intended to replace advice given to you by your health care provider. Make sure you discuss any questions you have with your health care provider.   

## 2014-02-28 LAB — CYTOLOGY - PAP

## 2014-03-02 LAB — MATERNAL SCREEN, INTEGRATED #2
AFP MoM: 1.23
AFP, SERUM MAT SCREEN: 45 ng/mL
Calculated Gestational Age: 16.4
Crown Rump Length: 61.9 mm
ESTRIOL FREE MAT SCREEN: 0.97 ng/mL
ESTRIOL MOM MAT SCREEN: 1
INHIBIN A MOM MAT SCREEN: 1.59
Inhibin A Dimeric: 292 pg/mL
MSS Down Syndrome: <1:5000 {titer}
NT MoM: 1.22
NUCHAL TRANSLUCENCY MAT SCREEN 2: 1.72 mm
Number of fetuses: 1
PAPP-A MoM: 1.55
PAPP-A: 1180 ng/mL
Rish for ONTD: 1:5000 {titer}
hCG MoM: 2.31
hCG, Serum: 91.1 IU/mL

## 2014-03-27 ENCOUNTER — Encounter: Payer: Medicaid Other | Admitting: Women's Health

## 2014-03-27 ENCOUNTER — Other Ambulatory Visit: Payer: Medicaid Other

## 2014-03-30 ENCOUNTER — Ambulatory Visit (INDEPENDENT_AMBULATORY_CARE_PROVIDER_SITE_OTHER): Payer: Medicaid Other | Admitting: Obstetrics & Gynecology

## 2014-03-30 ENCOUNTER — Encounter: Payer: Self-pay | Admitting: Obstetrics & Gynecology

## 2014-03-30 ENCOUNTER — Ambulatory Visit (INDEPENDENT_AMBULATORY_CARE_PROVIDER_SITE_OTHER): Payer: Medicaid Other

## 2014-03-30 ENCOUNTER — Other Ambulatory Visit: Payer: Self-pay | Admitting: Women's Health

## 2014-03-30 VITALS — BP 130/70 | Wt 140.0 lb

## 2014-03-30 DIAGNOSIS — O09892 Supervision of other high risk pregnancies, second trimester: Secondary | ICD-10-CM

## 2014-03-30 DIAGNOSIS — F1910 Other psychoactive substance abuse, uncomplicated: Secondary | ICD-10-CM

## 2014-03-30 DIAGNOSIS — Z363 Encounter for antenatal screening for malformations: Secondary | ICD-10-CM

## 2014-03-30 DIAGNOSIS — Z1389 Encounter for screening for other disorder: Secondary | ICD-10-CM

## 2014-03-30 DIAGNOSIS — Z36 Encounter for antenatal screening of mother: Secondary | ICD-10-CM

## 2014-03-30 DIAGNOSIS — O99322 Drug use complicating pregnancy, second trimester: Secondary | ICD-10-CM

## 2014-03-30 DIAGNOSIS — Z331 Pregnant state, incidental: Secondary | ICD-10-CM

## 2014-03-30 DIAGNOSIS — Z3402 Encounter for supervision of normal first pregnancy, second trimester: Secondary | ICD-10-CM

## 2014-03-30 LAB — POCT URINALYSIS DIPSTICK
GLUCOSE UA: NEGATIVE
LEUKOCYTES UA: NEGATIVE
NITRITE UA: NEGATIVE
Protein, UA: NEGATIVE
RBC UA: NEGATIVE

## 2014-03-30 NOTE — Progress Notes (Signed)
Sonogram is reviewed and report done.  All normal  G1P0 6616w6d Estimated Date of Delivery: 08/11/14  Blood pressure 130/70, weight 140 lb (63.504 kg), last menstrual period 11/04/2013.   BP weight and urine results all reviewed and noted.  Please refer to the obstetrical flow sheet for the fundal height and fetal heart rate documentation:  Patient reports good fetal movement, denies any bleeding and no rupture of membranes symptoms or regular contractions. Patient is without complaints. All questions were answered.  Plan:  Continued routine obstetrical care,   Follow up in 4 weeks for OB appointment, routine

## 2014-03-30 NOTE — Progress Notes (Signed)
U/S(20+6wks)-active fetus, meas c/w dates, fluid WNL, FHR-159 bpm, anterior Gr 0 placenta, cx appears closed (3.2cm), bilateral adnexa appears WNL, no major abnl noted

## 2014-03-30 NOTE — Addendum Note (Signed)
Addended by: Criss AlvinePULLIAM, Syd Manges G on: 03/30/2014 02:22 PM   Modules accepted: Orders

## 2014-04-27 ENCOUNTER — Ambulatory Visit (INDEPENDENT_AMBULATORY_CARE_PROVIDER_SITE_OTHER): Payer: Medicaid Other | Admitting: Advanced Practice Midwife

## 2014-04-27 VITALS — BP 120/70 | HR 88 | Wt 145.0 lb

## 2014-04-27 DIAGNOSIS — Z1389 Encounter for screening for other disorder: Secondary | ICD-10-CM

## 2014-04-27 DIAGNOSIS — Z331 Pregnant state, incidental: Secondary | ICD-10-CM

## 2014-04-27 DIAGNOSIS — Z3402 Encounter for supervision of normal first pregnancy, second trimester: Secondary | ICD-10-CM

## 2014-04-27 DIAGNOSIS — F1290 Cannabis use, unspecified, uncomplicated: Secondary | ICD-10-CM

## 2014-04-27 LAB — POCT URINALYSIS DIPSTICK
GLUCOSE UA: NEGATIVE
KETONES UA: NEGATIVE
LEUKOCYTES UA: NEGATIVE
NITRITE UA: NEGATIVE
Protein, UA: NEGATIVE
RBC UA: NEGATIVE

## 2014-04-27 NOTE — Patient Instructions (Signed)

## 2014-04-27 NOTE — Progress Notes (Signed)
G1P0 6917w6d Estimated Date of Delivery: 08/11/14  Blood pressure 120/70, pulse 88, weight 145 lb (65.772 kg), last menstrual period 11/04/2013.   BP weight and urine results all reviewed and noted.  Please refer to the obstetrical flow sheet for the fundal height and fetal heart rate documentation:  Patient reports good fetal movement, denies any bleeding and no rupture of membranes symptoms or regular contractions. Patient is without complaints. All questions were answered.  Plan:  Continued routine obstetrical care,   Follow up in 3 weeks for OB appointment, PN2

## 2014-04-28 LAB — PMP SCREEN PROFILE (10S), URINE
AMPHETAMINE SCRN UR: NEGATIVE ng/mL
BARBITURATE SCRN UR: NEGATIVE ng/mL
BENZODIAZEPINE SCREEN, URINE: NEGATIVE ng/mL
CREATININE(CRT), U: 169.9 mg/dL (ref 20.0–300.0)
Cannabinoids Ur Ql Scn: POSITIVE ng/mL
Cocaine(Metab.)Screen, Urine: NEGATIVE ng/mL
METHADONE SCREEN, URINE: NEGATIVE ng/mL
Opiate Scrn, Ur: NEGATIVE ng/mL
Oxycodone+Oxymorphone Ur Ql Scn: NEGATIVE ng/mL
PCP Scrn, Ur: NEGATIVE ng/mL
PH UR, DRUG SCRN: 5.9 (ref 4.5–8.9)
PROPOXYPHENE SCREEN: NEGATIVE ng/mL

## 2014-05-18 ENCOUNTER — Ambulatory Visit (INDEPENDENT_AMBULATORY_CARE_PROVIDER_SITE_OTHER): Payer: Medicaid Other | Admitting: Advanced Practice Midwife

## 2014-05-18 ENCOUNTER — Other Ambulatory Visit: Payer: Medicaid Other

## 2014-05-18 ENCOUNTER — Encounter: Payer: Self-pay | Admitting: Advanced Practice Midwife

## 2014-05-18 VITALS — BP 118/76 | HR 100 | Wt 145.0 lb

## 2014-05-18 DIAGNOSIS — Z331 Pregnant state, incidental: Secondary | ICD-10-CM

## 2014-05-18 DIAGNOSIS — Z3403 Encounter for supervision of normal first pregnancy, third trimester: Secondary | ICD-10-CM

## 2014-05-18 DIAGNOSIS — Z131 Encounter for screening for diabetes mellitus: Secondary | ICD-10-CM

## 2014-05-18 DIAGNOSIS — Z3402 Encounter for supervision of normal first pregnancy, second trimester: Secondary | ICD-10-CM

## 2014-05-18 DIAGNOSIS — Z1389 Encounter for screening for other disorder: Secondary | ICD-10-CM

## 2014-05-18 DIAGNOSIS — Z0184 Encounter for antibody response examination: Secondary | ICD-10-CM

## 2014-05-18 DIAGNOSIS — Z114 Encounter for screening for human immunodeficiency virus [HIV]: Secondary | ICD-10-CM

## 2014-05-18 DIAGNOSIS — Z113 Encounter for screening for infections with a predominantly sexual mode of transmission: Secondary | ICD-10-CM

## 2014-05-18 LAB — POCT URINALYSIS DIPSTICK
Blood, UA: NEGATIVE
Glucose, UA: NEGATIVE
Leukocytes, UA: NEGATIVE
Nitrite, UA: NEGATIVE

## 2014-05-18 NOTE — Progress Notes (Signed)
Pt states that she has noticed some pressure on her right side.

## 2014-05-18 NOTE — Progress Notes (Signed)
G1P0 6549w6d Estimated Date of Delivery: 08/11/14  Blood pressure 118/76, pulse 100, weight 145 lb (65.772 kg), last menstrual period 11/04/2013.   BP weight and urine results all reviewed and noted.  Please refer to the obstetrical flow sheet for the fundal height and fetal heart rate documentation:  Patient reports good fetal movement, denies any bleeding and no rupture of membranes symptoms or regular contractions. Patient is without complaints. All questions were answered.  Plan:  Continued routine obstetrical care, PN2 today  Follow up in 3 weeks for OB appointment,

## 2014-05-19 LAB — GLUCOSE TOLERANCE, 2 HOURS W/ 1HR
Glucose, 1 hour: 148 mg/dL (ref 65–179)
Glucose, 2 hour: 106 mg/dL (ref 65–152)
Glucose, Fasting: 78 mg/dL (ref 65–91)

## 2014-05-19 LAB — HSV 2 ANTIBODY, IGG: HSV 2 Glycoprotein G Ab, IgG: <0.91 {index} (ref 0.00–0.90)

## 2014-05-19 LAB — CBC
HEMATOCRIT: 29.7 % — AB (ref 34.0–46.6)
Hemoglobin: 9.7 g/dL — ABNORMAL LOW (ref 11.1–15.9)
MCH: 26.4 pg — AB (ref 26.6–33.0)
MCHC: 32.7 g/dL (ref 31.5–35.7)
MCV: 81 fL (ref 79–97)
PLATELETS: 282 10*3/uL (ref 150–379)
RBC: 3.68 x10E6/uL — ABNORMAL LOW (ref 3.77–5.28)
RDW: 14.2 % (ref 12.3–15.4)
WBC: 11.2 10*3/uL — ABNORMAL HIGH (ref 3.4–10.8)

## 2014-05-19 LAB — ANTIBODY SCREEN: Antibody Screen: NEGATIVE

## 2014-05-19 LAB — HIV ANTIBODY (ROUTINE TESTING W REFLEX): HIV SCREEN 4TH GENERATION: NONREACTIVE

## 2014-05-19 LAB — RPR: RPR Ser Ql: NONREACTIVE

## 2014-06-08 ENCOUNTER — Encounter: Payer: Self-pay | Admitting: Women's Health

## 2014-06-08 ENCOUNTER — Ambulatory Visit (INDEPENDENT_AMBULATORY_CARE_PROVIDER_SITE_OTHER): Payer: Medicaid Other | Admitting: Women's Health

## 2014-06-08 VITALS — BP 120/60 | HR 80 | Wt 149.0 lb

## 2014-06-08 DIAGNOSIS — Z3403 Encounter for supervision of normal first pregnancy, third trimester: Secondary | ICD-10-CM

## 2014-06-08 DIAGNOSIS — Z331 Pregnant state, incidental: Secondary | ICD-10-CM

## 2014-06-08 DIAGNOSIS — Z1389 Encounter for screening for other disorder: Secondary | ICD-10-CM

## 2014-06-08 DIAGNOSIS — F1290 Cannabis use, unspecified, uncomplicated: Secondary | ICD-10-CM

## 2014-06-08 DIAGNOSIS — O99013 Anemia complicating pregnancy, third trimester: Secondary | ICD-10-CM

## 2014-06-08 LAB — POCT URINALYSIS DIPSTICK
Blood, UA: NEGATIVE
Glucose, UA: NEGATIVE
KETONES UA: NEGATIVE
Leukocytes, UA: NEGATIVE
Nitrite, UA: NEGATIVE
PROTEIN UA: NEGATIVE

## 2014-06-08 MED ORDER — FERROUS SULFATE 325 (65 FE) MG PO TABS: 325.0000 mg | ORAL_TABLET | Freq: Two times a day (BID) | ORAL | Status: AC

## 2014-06-08 NOTE — Patient Instructions (Addendum)
Call the office ([phone]) or go to [facility] if:  You begin to have strong, frequent contractions  Your water breaks.  Sometimes it is a big gush of fluid, sometimes it is just a trickle that keeps getting your panties wet or running down your legs  You have vaginal bleeding.  It is normal to have a small amount of spotting if your cervix was checked.   You don't feel your baby moving like normal.  If you don't, get you something to eat and drink and lay down and focus on feeling your baby move.  You should feel at least 10 movements in 2 hours.  If you don't, you should call the office or go to [facility].    Tdap Vaccine  It is recommended that you get the Tdap vaccine during the third trimester of EACH pregnancy to help protect your baby from getting pertussis (whooping cough)  27-36 weeks is the BEST time to do this so that you can pass the protection on to your baby. During pregnancy is better than after pregnancy, but if you are unable to get it during pregnancy it will be offered at the hospital.   You can get this vaccine at the health department or your family doctor  Everyone who will be around your baby should also be up-to-date on their vaccines. Adults (who are not pregnant) only need 1 dose of Tdap during adulthood.   [place] Pediatricians/Family Doctors:  [facility] [phone]            [facility] [phone]                 [facility] [phone] (usually not accepting new patients unless you have family there already, you are always welcome to call and ask)            [facility] ([address] [place]) [phone]   [place] Pediatricians/Family Doctors:   [facility]: [phone]  [facility]: [phone]    Third Trimester of Pregnancy The third trimester is from week 29 through week 42, months 7 through 9. The third trimester is a time when the  fetus is growing rapidly. At the end of the ninth month, the fetus is about 20 inches in length and weighs 6-10 pounds.  BODY CHANGES Your body goes through many changes during pregnancy. The changes vary from woman to woman.   Your weight will continue to increase. You can expect to gain 25-35 pounds (11-16 kg) by the end of the pregnancy.  You may begin to get stretch marks on your hips, abdomen, and breasts.  You may urinate more often because the fetus is moving lower into your pelvis and pressing on your bladder.  You may develop or continue to have heartburn as a result of your pregnancy.  You may develop constipation because certain hormones are causing the muscles that push waste through your intestines to slow down.  You may develop hemorrhoids or swollen, bulging veins (varicose veins).  You may have pelvic pain because of the weight gain and pregnancy hormones relaxing your joints between the bones in your pelvis. Backaches may result from overexertion of the muscles supporting your posture.  You may have changes in your hair. These can include thickening of your hair, rapid growth, and changes in texture. Some women also have hair loss during or after pregnancy, or hair that feels dry or thin. Your hair will most likely return to normal after   your baby is born.  Your breasts will continue to grow and be tender. A yellow discharge may leak from your breasts called colostrum.  Your belly button may stick out.  You may feel short of breath because of your expanding uterus.  You may notice the fetus "dropping," or moving lower in your abdomen.  You may have a bloody mucus discharge. This usually occurs a few days to a week before labor begins.  Your cervix becomes thin and soft (effaced) near your due date. WHAT TO EXPECT AT YOUR PRENATAL EXAMS  You will have prenatal exams every 2 weeks until week 36. Then, you will have weekly prenatal exams. During a routine prenatal  visit:  You will be weighed to make sure you and the fetus are growing normally.  Your blood pressure is taken.  Your abdomen will be measured to track your baby's growth.  The fetal heartbeat will be listened to.  Any test results from the previous visit will be discussed.  You may have a cervical check near your due date to see if you have effaced. At around 36 weeks, your caregiver will check your cervix. At the same time, your caregiver will also perform a test on the secretions of the vaginal tissue. This test is to determine if a type of bacteria, Group B streptococcus, is present. Your caregiver will explain this further. Your caregiver may ask you:  What your birth plan is.  How you are feeling.  If you are feeling the baby move.  If you have had any abnormal symptoms, such as leaking fluid, bleeding, severe headaches, or abdominal cramping.  If you have any questions. Other tests or screenings that may be performed during your third trimester include:  Blood tests that check for low iron levels (anemia).  Fetal testing to check the health, activity level, and growth of the fetus. Testing is done if you have certain medical conditions or if there are problems during the pregnancy. FALSE LABOR You may feel small, irregular contractions that eventually go away. These are called Braxton Hicks contractions, or false labor. Contractions may last for hours, days, or even weeks before true labor sets in. If contractions come at regular intervals, intensify, or become painful, it is best to be seen by your caregiver.  SIGNS OF LABOR   Menstrual-like cramps.  Contractions that are 5 minutes apart or less.  Contractions that start on the top of the uterus and spread down to the lower abdomen and back.  A sense of increased pelvic pressure or back pain.  A watery or bloody mucus discharge that comes from the vagina. If you have any of these signs before the 37th week of  pregnancy, call your caregiver right away. You need to go to the hospital to get checked immediately. HOME CARE INSTRUCTIONS   Avoid all smoking, herbs, alcohol, and unprescribed drugs. These chemicals affect the formation and growth of the baby.  Follow your caregiver's instructions regarding medicine use. There are medicines that are either safe or unsafe to take during pregnancy.  Exercise only as directed by your caregiver. Experiencing uterine cramps is a good sign to stop exercising.  Continue to eat regular, healthy meals.  Wear a good support bra for breast tenderness.  Do not use hot tubs, steam rooms, or saunas.  Wear your seat belt at all times when driving.  Avoid raw meat, uncooked cheese, cat litter boxes, and soil used by cats. These carry germs that can cause birth   defects in the baby.  Take your prenatal vitamins.  Try taking a stool softener (if your caregiver approves) if you develop constipation. Eat more high-fiber foods, such as fresh vegetables or fruit and whole grains. Drink plenty of fluids to keep your urine clear or pale yellow.  Take warm sitz baths to soothe any pain or discomfort caused by hemorrhoids. Use hemorrhoid cream if your caregiver approves.  If you develop varicose veins, wear support hose. Elevate your feet for 15 minutes, 3-4 times a day. Limit salt in your diet.  Avoid heavy lifting, wear low heal shoes, and practice good posture.  Rest a lot with your legs elevated if you have leg cramps or low back pain.  Visit your dentist if you have not gone during your pregnancy. Use a soft toothbrush to brush your teeth and be gentle when you floss.  A sexual relationship may be continued unless your caregiver directs you otherwise.  Do not travel far distances unless it is absolutely necessary and only with the approval of your caregiver.  Take prenatal classes to understand, practice, and ask questions about the labor and delivery.  Make a  trial run to the hospital.  Pack your hospital bag.  Prepare the baby's nursery.  Continue to go to all your prenatal visits as directed by your caregiver. SEEK MEDICAL CARE IF:  You are unsure if you are in labor or if your water has broken.  You have dizziness.  You have mild pelvic cramps, pelvic pressure, or nagging pain in your abdominal area.  You have persistent nausea, vomiting, or diarrhea.  You have a bad smelling vaginal discharge.  You have pain with urination. SEEK IMMEDIATE MEDICAL CARE IF:   You have a fever.  You are leaking fluid from your vagina.  You have spotting or bleeding from your vagina.  You have severe abdominal cramping or pain.  You have rapid weight loss or gain.  You have shortness of breath with chest pain.  You notice sudden or extreme swelling of your face, hands, ankles, feet, or legs.  You have not felt your baby move in over an hour.  You have severe headaches that do not go away with medicine.  You have vision changes. Document Released: 01/21/2001 Document Revised: 02/01/2013 Document Reviewed: 03/30/2012 [facility] Patient Information 2015 [place], [place]. This information is not intended to replace advice given to you by your health care provider. Make sure you discuss any questions you have with your health care provider.   Iron-Rich Diet An iron-rich diet contains foods that are good sources of iron. Iron is an important mineral that helps your body produce hemoglobin. Hemoglobin is a protein in red blood cells that carries oxygen to the body's tissues. Sometimes, the iron level in your blood can be low. This may be caused by:  A lack of iron in your diet.  Blood loss.  Times of growth, such as during pregnancy or during a child's growth and development. Low levels of iron can cause a decrease in the number of red blood cells. This can result in iron deficiency anemia. Iron deficiency anemia symptoms  include:  Tiredness.  Weakness.  Irritability.  Increased chance of infection. Here are some recommendations for daily iron intake:  Males older than 24 years of age need 8 mg of iron per day.  Women ages 87 to 85 need 18 mg of iron per day.  Pregnant women need 27 mg of iron per day, and women who are  over 24 years of age and breastfeeding need 9 mg of iron per day.  Women over the age of 24 need 8 mg of iron per day. SOURCES OF IRON There are 2 types of iron that are found in food: heme iron and nonheme iron. Heme iron is absorbed by the body better than nonheme iron. Heme iron is found in meat, poultry, and fish. Nonheme iron is found in grains, beans, and vegetables. Heme Iron Sources Food / Iron (mg)  Chicken liver, 3 oz (85 g)/ 10 mg  Beef liver, 3 oz (85 g)/ 5.5 mg  Oysters, 3 oz (85 g)/ 8 mg  Beef, 3 oz (85 g)/ 2 to 3 mg  Shrimp, 3 oz (85 g)/ 2.8 mg  [place], 3 oz (85 g)/ 2 mg  Chicken, 3 oz (85 g) / 1 mg  Fish (tuna, halibut), 3 oz (85 g)/ 1 mg  Pork, 3 oz (85 g)/ 0.9 mg Nonheme Iron Sources Food / Iron (mg)  Ready-to-eat breakfast cereal, iron-fortified / 3.9 to 7 mg  Tofu,  cup / 3.4 mg  Kidney beans,  cup / 2.6 mg  Baked potato with skin / 2.7 mg  Asparagus,  cup / 2.2 mg  Avocado / 2 mg  Dried peaches,  cup / 1.6 mg  Raisins,  cup / 1.5 mg  Soy milk, 1 cup / 1.5 mg  Whole-wheat bread, 1 slice / 1.2 mg  Spinach, 1 cup / 0.8 mg  Broccoli,  cup / 0.6 mg IRON ABSORPTION Certain foods can decrease the body's absorption of iron. Try to avoid these foods and beverages while eating meals with iron-containing foods:  Coffee.  Tea.  Fiber.  Soy. Foods containing vitamin C can help increase the amount of iron your body absorbs from iron sources, especially from nonheme sources. Eat foods with vitamin C along with iron-containing foods to increase your iron absorption. Foods that are high in vitamin C include many fruits and  vegetables. Some good sources are:  Fresh orange juice.  Oranges.  Strawberries.  Mangoes.  Grapefruit.  Red bell peppers.  Green bell peppers.  Broccoli.  Potatoes with skin.  Tomato juice. Document Released: 09/10/2004 Document Revised: 04/21/2011 Document Reviewed: 07/18/2010 [facility] Patient Information 2015 [place]. This information is not intended to replace advice given to you by your health care provider. Make sure you discuss any questions you have with your health care provider.

## 2014-06-08 NOTE — Progress Notes (Signed)
Low-risk OB appointment G1P0 3953w6d Estimated Date of Delivery: 08/11/14 BP 120/60 mmHg  Pulse 80  Wt 149 lb (67.586 kg)  LMP 11/04/2013  BP, weight, and urine reviewed.  Refer to obstetrical flow sheet for FH & FHR.  Reports good fm.  Denies regular uc's, lof, vb, or uti s/s. No complaints. Reviewed pn2 labs, anemic- rx'd fe- to increase fe-rich foods.  Discussed ptl s/s, fkc. Recommended Tdap at HD/PCP per CDC guidelines.  Plan:  Continue routine obstetrical care  F/U in 2wks for OB appointment

## 2014-06-22 ENCOUNTER — Ambulatory Visit (INDEPENDENT_AMBULATORY_CARE_PROVIDER_SITE_OTHER): Payer: Medicaid Other | Admitting: Advanced Practice Midwife

## 2014-06-22 VITALS — BP 122/70 | HR 80 | Wt 149.0 lb

## 2014-06-22 DIAGNOSIS — Z1389 Encounter for screening for other disorder: Secondary | ICD-10-CM

## 2014-06-22 DIAGNOSIS — Z3403 Encounter for supervision of normal first pregnancy, third trimester: Secondary | ICD-10-CM

## 2014-06-22 DIAGNOSIS — Z331 Pregnant state, incidental: Secondary | ICD-10-CM

## 2014-06-22 LAB — POCT URINALYSIS DIPSTICK
Glucose, UA: NEGATIVE
Ketones, UA: NEGATIVE
LEUKOCYTES UA: NEGATIVE
Nitrite, UA: NEGATIVE
PROTEIN UA: NEGATIVE
RBC UA: NEGATIVE

## 2014-06-22 NOTE — Progress Notes (Signed)
G1P0 7226w6d Estimated Date of Delivery: 08/11/14  Last menstrual period 11/04/2013.   BP weight and urine results all reviewed and noted.  Please refer to the obstetrical flow sheet for the fundal height and fetal heart rate documentation:  Patient reports good fetal movement, denies any bleeding and no rupture of membranes symptoms or regular contractions. Patient c/o "sharp abdominal pains" a few times today:  Seems like braxton hicks All questions were answered.  Plan:  Continued routine obstetrical care,   Follow up in 2 weeks for OB appointment,

## 2014-07-06 ENCOUNTER — Ambulatory Visit (INDEPENDENT_AMBULATORY_CARE_PROVIDER_SITE_OTHER): Payer: Medicaid Other | Admitting: Advanced Practice Midwife

## 2014-07-06 ENCOUNTER — Encounter: Payer: Self-pay | Admitting: Advanced Practice Midwife

## 2014-07-06 VITALS — BP 110/70 | HR 80 | Wt 152.0 lb

## 2014-07-06 DIAGNOSIS — Z331 Pregnant state, incidental: Secondary | ICD-10-CM

## 2014-07-06 DIAGNOSIS — R825 Elevated urine levels of drugs, medicaments and biological substances: Secondary | ICD-10-CM

## 2014-07-06 DIAGNOSIS — Z3403 Encounter for supervision of normal first pregnancy, third trimester: Secondary | ICD-10-CM

## 2014-07-06 DIAGNOSIS — Z1389 Encounter for screening for other disorder: Secondary | ICD-10-CM

## 2014-07-06 LAB — POCT URINALYSIS DIPSTICK
Blood, UA: NEGATIVE
GLUCOSE UA: NEGATIVE
KETONES UA: NEGATIVE
Leukocytes, UA: NEGATIVE
Nitrite, UA: NEGATIVE
Protein, UA: NEGATIVE

## 2014-07-06 NOTE — Progress Notes (Signed)
G1P0 4118w6d Estimated Date of Delivery: 08/11/14  Blood pressure 110/70, pulse 80, weight 152 lb (68.947 kg), last menstrual period 11/04/2013.   BP weight and urine results all reviewed and noted.  Please refer to the obstetrical flow sheet for the fundal height and fetal heart rate documentation:  Patient reports good fetal movement, denies any bleeding and no rupture of membranes symptoms or regular contractions. Patient is without complaints. All questions were answered.  Plan:  Continued routine obstetrical care,   Follow up in 2 weeks for OB appointment, GBS

## 2014-07-06 NOTE — Progress Notes (Signed)
Pt denies any problems or concerns at this time.  

## 2014-07-07 LAB — PMP SCREEN PROFILE (10S), URINE
AMPHETAMINE SCRN UR: NEGATIVE ng/mL
Barbiturate Screen, Ur: NEGATIVE ng/mL
Benzodiazepine Screen, Urine: NEGATIVE ng/mL
CANNABINOIDS UR QL SCN: POSITIVE ng/mL
Cocaine(Metab.)Screen, Urine: NEGATIVE ng/mL
Creatinine(Crt), U: 151.7 mg/dL (ref 20.0–300.0)
Methadone Scn, Ur: NEGATIVE ng/mL
OXYCODONE+OXYMORPHONE UR QL SCN: NEGATIVE ng/mL
Opiate Scrn, Ur: NEGATIVE ng/mL
PCP Scrn, Ur: NEGATIVE ng/mL
PH UR, DRUG SCRN: 6.1 (ref 4.5–8.9)
Propoxyphene, Screen: NEGATIVE ng/mL

## 2014-07-20 ENCOUNTER — Encounter: Payer: Self-pay | Admitting: Advanced Practice Midwife

## 2014-07-20 ENCOUNTER — Ambulatory Visit (INDEPENDENT_AMBULATORY_CARE_PROVIDER_SITE_OTHER): Payer: Medicaid Other | Admitting: Advanced Practice Midwife

## 2014-07-20 VITALS — BP 116/70 | HR 88 | Wt 155.5 lb

## 2014-07-20 DIAGNOSIS — Z369 Encounter for antenatal screening, unspecified: Secondary | ICD-10-CM

## 2014-07-20 DIAGNOSIS — Z1389 Encounter for screening for other disorder: Secondary | ICD-10-CM

## 2014-07-20 DIAGNOSIS — Z3403 Encounter for supervision of normal first pregnancy, third trimester: Secondary | ICD-10-CM

## 2014-07-20 DIAGNOSIS — Z331 Pregnant state, incidental: Secondary | ICD-10-CM

## 2014-07-20 LAB — POCT URINALYSIS DIPSTICK
Blood, UA: NEGATIVE
Glucose, UA: NEGATIVE
Ketones, UA: NEGATIVE
Leukocytes, UA: NEGATIVE
Nitrite, UA: NEGATIVE
PROTEIN UA: NEGATIVE

## 2014-07-20 LAB — OB RESULTS CONSOLE GC/CHLAMYDIA
CHLAMYDIA, DNA PROBE: NEGATIVE
Gonorrhea: NEGATIVE

## 2014-07-20 LAB — OB RESULTS CONSOLE GBS: STREP GROUP B AG: NEGATIVE

## 2014-07-20 NOTE — Patient Instructions (Signed)

## 2014-07-20 NOTE — Progress Notes (Signed)
G1P0 [redacted]w[redacted]d Estimated Date of Delivery: 08/11/14  Blood pressure 116/70, pulse 88, weight 155 lb 8 oz (70.534 kg), last menstrual period 11/04/2013.   BP weight and urine results all reviewed and noted.  Please refer to the obstetrical flow sheet for the fundal height and fetal heart rate documentation:  Patient reports good fetal movement, denies any bleeding and no rupture of membranes symptoms or regular contractions. Patient is without complaints. All questions were answered.  Plan:  Continued routine obstetrical care, GBS today  Follow up in 1 weeks for OB appointment,

## 2014-07-23 LAB — GC/CHLAMYDIA PROBE AMP
Chlamydia trachomatis, NAA: NEGATIVE
NEISSERIA GONORRHOEAE BY PCR: NEGATIVE

## 2014-07-24 LAB — CULTURE, BETA STREP (GROUP B ONLY): STREP GP B CULTURE: NEGATIVE

## 2014-07-27 ENCOUNTER — Ambulatory Visit (INDEPENDENT_AMBULATORY_CARE_PROVIDER_SITE_OTHER): Payer: Medicaid Other | Admitting: Advanced Practice Midwife

## 2014-07-27 VITALS — BP 120/80 | HR 84 | Wt 154.2 lb

## 2014-07-27 DIAGNOSIS — Z331 Pregnant state, incidental: Secondary | ICD-10-CM

## 2014-07-27 DIAGNOSIS — Z3403 Encounter for supervision of normal first pregnancy, third trimester: Secondary | ICD-10-CM

## 2014-07-27 DIAGNOSIS — Z1389 Encounter for screening for other disorder: Secondary | ICD-10-CM

## 2014-07-27 LAB — POCT URINALYSIS DIPSTICK
GLUCOSE UA: NEGATIVE
KETONES UA: NEGATIVE
Leukocytes, UA: NEGATIVE
Nitrite, UA: NEGATIVE
Protein, UA: NEGATIVE
RBC UA: NEGATIVE

## 2014-08-03 ENCOUNTER — Encounter: Payer: Self-pay | Admitting: Obstetrics & Gynecology

## 2014-08-03 ENCOUNTER — Ambulatory Visit (INDEPENDENT_AMBULATORY_CARE_PROVIDER_SITE_OTHER): Payer: Medicaid Other | Admitting: Obstetrics & Gynecology

## 2014-08-03 VITALS — BP 130/80 | HR 72 | Wt 156.4 lb

## 2014-08-03 DIAGNOSIS — Z3A38 38 weeks gestation of pregnancy: Secondary | ICD-10-CM | POA: Diagnosis not present

## 2014-08-03 DIAGNOSIS — Z3403 Encounter for supervision of normal first pregnancy, third trimester: Secondary | ICD-10-CM

## 2014-08-03 DIAGNOSIS — Z331 Pregnant state, incidental: Secondary | ICD-10-CM

## 2014-08-03 DIAGNOSIS — Z1389 Encounter for screening for other disorder: Secondary | ICD-10-CM

## 2014-08-03 LAB — POCT URINALYSIS DIPSTICK
Glucose, UA: NEGATIVE
Ketones, UA: NEGATIVE
Leukocytes, UA: NEGATIVE
Nitrite, UA: NEGATIVE
Protein, UA: NEGATIVE

## 2014-08-03 NOTE — Progress Notes (Signed)
G1P0 [redacted]w[redacted]d Estimated Date of Delivery: 08/11/14  Blood pressure 130/80, pulse 72, weight 156 lb 6.4 oz (70.943 kg), last menstrual period 11/04/2013.   BP weight and urine results all reviewed and noted.  Please refer to the obstetrical flow sheet for the fundal height and fetal heart rate documentation:  Patient reports good fetal movement, denies any bleeding and no rupture of membranes symptoms or regular contractions. Patient is without complaints. All questions were answered.  Plan:  Continued routine obstetrical care,   Follow up in 1 weeks for OB appointment,

## 2014-08-04 ENCOUNTER — Inpatient Hospital Stay (HOSPITAL_COMMUNITY)
Admission: AD | Admit: 2014-08-04 | Discharge: 2014-08-04 | Disposition: A | Discharge status: Home / Self Care | Payer: Medicaid Other | Source: Ambulatory Visit | Attending: Family Medicine | Admitting: Family Medicine

## 2014-08-04 ENCOUNTER — Encounter (HOSPITAL_COMMUNITY): Payer: Self-pay

## 2014-08-04 DIAGNOSIS — Z3A39 39 weeks gestation of pregnancy: Secondary | ICD-10-CM | POA: Diagnosis present

## 2014-08-04 DIAGNOSIS — Z3493 Encounter for supervision of normal pregnancy, unspecified, third trimester: Secondary | ICD-10-CM | POA: Insufficient documentation

## 2014-08-04 DIAGNOSIS — Z87891 Personal history of nicotine dependence: Secondary | ICD-10-CM

## 2014-08-04 NOTE — Discharge Instructions (Signed)
Braxton Hicks Contractions °Contractions of the uterus can occur throughout pregnancy. Contractions are not always a sign that you are in labor.  °WHAT ARE BRAXTON HICKS CONTRACTIONS?  °Contractions that occur before labor are called Braxton Hicks contractions, or false labor. Toward the end of pregnancy (32-34 weeks), these contractions can develop more often and may become more forceful. This is not true labor because these contractions do not result in opening (dilatation) and thinning of the cervix. They are sometimes difficult to tell apart from true labor because these contractions can be forceful and people have different pain tolerances. You should not feel embarrassed if you go to the hospital with false labor. Sometimes, the only way to tell if you are in true labor is for your health care provider to look for changes in the cervix. °If there are no prenatal problems or other health problems associated with the pregnancy, it is completely safe to be sent home with false labor and await the onset of true labor. °HOW CAN YOU TELL THE DIFFERENCE BETWEEN TRUE AND FALSE LABOR? °False Labor °· The contractions of false labor are usually shorter and not as hard as those of true labor.   °· The contractions are usually irregular.   °· The contractions are often felt in the front of the lower abdomen and in the groin.   °· The contractions may go away when you walk around or change positions while lying down.   °· The contractions get weaker and are shorter lasting as time goes on.   °· The contractions do not usually become progressively stronger, regular, and closer together as with true labor.   °True Labor °1. Contractions in true labor last 30-70 seconds, become very regular, usually become more intense, and increase in frequency.   °2. The contractions do not go away with walking.   °3. The discomfort is usually felt in the top of the uterus and spreads to the lower abdomen and low back.   °4. True labor can  be determined by your health care provider with an exam. This will show that the cervix is dilating and getting thinner.   °WHAT TO REMEMBER °· Keep up with your usual exercises and follow other instructions given by your health care provider.   °· Take medicines as directed by your health care provider.   °· Keep your regular prenatal appointments.   °· Eat and drink lightly if you think you are going into labor.   °· If Braxton Hicks contractions are making you uncomfortable:   °· Change your position from lying down or resting to walking, or from walking to resting.   °· Sit and rest in a tub of warm water.   °· Drink 2-3 glasses of water. Dehydration may cause these contractions.   °· Do slow and deep breathing several times an hour.   °WHEN SHOULD I SEEK IMMEDIATE MEDICAL CARE? °Seek immediate medical care if: °· Your contractions become stronger, more regular, and closer together.   °· You have fluid leaking or gushing from your vagina.   °· You have a fever.   °· You pass blood-tinged mucus.   °· You have vaginal bleeding.   °· You have continuous abdominal pain.   °· You have low back pain that you never had before.   °· You feel your baby's head pushing down and causing pelvic pressure.   °· Your baby is not moving as much as it used to.   °Document Released: 01/27/2005 Document Revised: 02/01/2013 Document Reviewed: 11/08/2012 °[employer]® Patient Information ©2015 [employer]. This information is not intended to replace advice given to you by your health care   provider. Make sure you discuss any questions you have with your health care provider. ° °Fetal Movement Counts °Patient Name: __________________________________________________ Patient Due Date: ____________________ °Performing a fetal movement count is highly recommended in high-risk pregnancies, but it is good for every pregnant woman to do. Your health care provider may ask you to start counting fetal movements at 28 weeks of the pregnancy. Fetal  movements often increase: °· After eating a full meal. °· After physical activity. °· After eating or drinking something sweet or cold. °· At rest. °Pay attention to when you feel the baby is most active. This will help you notice a pattern of your baby's sleep and wake cycles and what factors contribute to an increase in fetal movement. It is important to perform a fetal movement count at the same time each day when your baby is normally most active.  °HOW TO COUNT FETAL MOVEMENTS °5. Find a quiet and comfortable area to sit or lie down on your left side. Lying on your left side provides the best blood and oxygen circulation to your baby. °6. Write down the day and time on a sheet of paper or in a journal. °7. Start counting kicks, flutters, swishes, rolls, or jabs in a 2-hour period. You should feel at least 10 movements within 2 hours. °8. If you do not feel 10 movements in 2 hours, wait 2-3 hours and count again. Look for a change in the pattern or not enough counts in 2 hours. °SEEK MEDICAL CARE IF: °· You feel less than 10 counts in 2 hours, tried twice. °· There is no movement in over an hour. °· The pattern is changing or taking longer each day to reach 10 counts in 2 hours. °· You feel the baby is not moving as he or she usually does. °Date: ____________ Movements: ____________ Start time: ____________ Finish time: ____________  °Date: ____________ Movements: ____________ Start time: ____________ Finish time: ____________ °Date: ____________ Movements: ____________ Start time: ____________ Finish time: ____________ °Date: ____________ Movements: ____________ Start time: ____________ Finish time: ____________ °Date: ____________ Movements: ____________ Start time: ____________ Finish time: ____________ °Date: ____________ Movements: ____________ Start time: ____________ Finish time: ____________ °Date: ____________ Movements: ____________ Start time: ____________ Finish time: ____________ °Date: ____________  Movements: ____________ Start time: ____________ Finish time: ____________  °Date: ____________ Movements: ____________ Start time: ____________ Finish time: ____________ °Date: ____________ Movements: ____________ Start time: ____________ Finish time: ____________ °Date: ____________ Movements: ____________ Start time: ____________ Finish time: ____________ °Date: ____________ Movements: ____________ Start time: ____________ Finish time: ____________ °Date: ____________ Movements: ____________ Start time: ____________ Finish time: ____________ °Date: ____________ Movements: ____________ Start time: ____________ Finish time: ____________ °Date: ____________ Movements: ____________ Start time: ____________ Finish time: ____________  °Date: ____________ Movements: ____________ Start time: ____________ Finish time: ____________ °Date: ____________ Movements: ____________ Start time: ____________ Finish time: ____________ °Date: ____________ Movements: ____________ Start time: ____________ Finish time: ____________ °Date: ____________ Movements: ____________ Start time: ____________ Finish time: ____________ °Date: ____________ Movements: ____________ Start time: ____________ Finish time: ____________ °Date: ____________ Movements: ____________ Start time: ____________ Finish time: ____________ °Date: ____________ Movements: ____________ Start time: ____________ Finish time: ____________  °Date: ____________ Movements: ____________ Start time: ____________ Finish time: ____________ °Date: ____________ Movements: ____________ Start time: ____________ Finish time: ____________ °Date: ____________ Movements: ____________ Start time: ____________ Finish time: ____________ °Date: ____________ Movements: ____________ Start time: ____________ Finish time: ____________ °Date: ____________ Movements: ____________ Start time: ____________ Finish time: ____________ °Date: ____________ Movements: ____________ Start time:  ____________ Finish time: ____________ °Date: ____________ Movements:   ____________ Start time: ____________ Finish time: ____________  °Date: ____________ Movements: ____________ Start time: ____________ Finish time: ____________ °Date: ____________ Movements: ____________ Start time: ____________ Finish time: ____________ °Date: ____________ Movements: ____________ Start time: ____________ Finish time: ____________ °Date: ____________ Movements: ____________ Start time: ____________ Finish time: ____________ °Date: ____________ Movements: ____________ Start time: ____________ Finish time: ____________ °Date: ____________ Movements: ____________ Start time: ____________ Finish time: ____________ °Date: ____________ Movements: ____________ Start time: ____________ Finish time: ____________  °Date: ____________ Movements: ____________ Start time: ____________ Finish time: ____________ °Date: ____________ Movements: ____________ Start time: ____________ Finish time: ____________ °Date: ____________ Movements: ____________ Start time: ____________ Finish time: ____________ °Date: ____________ Movements: ____________ Start time: ____________ Finish time: ____________ °Date: ____________ Movements: ____________ Start time: ____________ Finish time: ____________ °Date: ____________ Movements: ____________ Start time: ____________ Finish time: ____________ °Date: ____________ Movements: ____________ Start time: ____________ Finish time: ____________  °Date: ____________ Movements: ____________ Start time: ____________ Finish time: ____________ °Date: ____________ Movements: ____________ Start time: ____________ Finish time: ____________ °Date: ____________ Movements: ____________ Start time: ____________ Finish time: ____________ °Date: ____________ Movements: ____________ Start time: ____________ Finish time: ____________ °Date: ____________ Movements: ____________ Start time: ____________ Finish time: ____________ °Date:  ____________ Movements: ____________ Start time: ____________ Finish time: ____________ °Date: ____________ Movements: ____________ Start time: ____________ Finish time: ____________  °Date: ____________ Movements: ____________ Start time: ____________ Finish time: ____________ °Date: ____________ Movements: ____________ Start time: ____________ Finish time: ____________ °Date: ____________ Movements: ____________ Start time: ____________ Finish time: ____________ °Date: ____________ Movements: ____________ Start time: ____________ Finish time: ____________ °Date: ____________ Movements: ____________ Start time: ____________ Finish time: ____________ °Date: ____________ Movements: ____________ Start time: ____________ Finish time: ____________ °Document Released: 02/26/2006 Document Revised: 06/13/2013 Document Reviewed: 11/24/2011 °[employer]® Patient Information ©2015 [employer]. This information is not intended to replace advice given to you by your health care provider. Make sure you discuss any questions you have with your health care provider. ° °

## 2014-08-04 NOTE — MAU Note (Signed)
Contractions worse tonight; every 11 minutes on the way here; last contractions was 30 minutes ago. Denies LOF or vaginal bleeding. Positive fetal movement.

## 2014-08-05 ENCOUNTER — Encounter (HOSPITAL_COMMUNITY): Payer: Self-pay | Admitting: *Deleted

## 2014-08-05 ENCOUNTER — Inpatient Hospital Stay (HOSPITAL_COMMUNITY): Payer: Medicaid Other | Admitting: Anesthesiology

## 2014-08-05 ENCOUNTER — Inpatient Hospital Stay (HOSPITAL_COMMUNITY)
Admission: AD | Admit: 2014-08-05 | Discharge: 2014-08-07 | DRG: 775 | Disposition: A | Discharge status: Home / Self Care | Payer: Medicaid Other | Source: Ambulatory Visit | Attending: Obstetrics & Gynecology | Admitting: Obstetrics & Gynecology

## 2014-08-05 DIAGNOSIS — Z87891 Personal history of nicotine dependence: Secondary | ICD-10-CM | POA: Diagnosis not present

## 2014-08-05 DIAGNOSIS — Z3A39 39 weeks gestation of pregnancy: Secondary | ICD-10-CM | POA: Diagnosis present

## 2014-08-05 LAB — RAPID URINE DRUG SCREEN, HOSP PERFORMED
Amphetamines: NOT DETECTED
BARBITURATES: NOT DETECTED
BENZODIAZEPINES: NOT DETECTED
COCAINE: NOT DETECTED
Opiates: NOT DETECTED
TETRAHYDROCANNABINOL: NOT DETECTED

## 2014-08-05 LAB — CBC
HEMATOCRIT: 32.5 % — AB (ref 36.0–46.0)
HEMOGLOBIN: 10.4 g/dL — AB (ref 12.0–15.0)
MCH: 23.7 pg — ABNORMAL LOW (ref 26.0–34.0)
MCHC: 32 g/dL (ref 30.0–36.0)
MCV: 74 fL — ABNORMAL LOW (ref 78.0–100.0)
PLATELETS: 180 10*3/uL (ref 150–400)
RBC: 4.39 MIL/uL (ref 3.87–5.11)
RDW: 19.1 % — ABNORMAL HIGH (ref 11.5–15.5)
WBC: 10.3 10*3/uL (ref 4.0–10.5)

## 2014-08-05 LAB — TYPE AND SCREEN
ABO/RH(D): B POS
Antibody Screen: NEGATIVE

## 2014-08-05 LAB — RPR: RPR Ser Ql: NONREACTIVE

## 2014-08-05 LAB — ABO/RH: ABO/RH(D): B POS

## 2014-08-05 MED ORDER — LACTATED RINGERS IV SOLN
500.0000 mL | INTRAVENOUS | Status: DC | PRN
  Administered 2014-08-05: 500 mL via INTRAVENOUS
  Administered 2014-08-05: 1000 mL via INTRAVENOUS

## 2014-08-05 MED ORDER — FENTANYL CITRATE (PF) 100 MCG/2ML IJ SOLN: 100.0000 ug | INTRAMUSCULAR | Status: DC | PRN

## 2014-08-05 MED ORDER — CITRIC ACID-SODIUM CITRATE 334-500 MG/5ML PO SOLN: 30.0000 mL | ORAL | Status: DC | PRN

## 2014-08-05 MED ORDER — DIPHENHYDRAMINE HCL 50 MG/ML IJ SOLN: 12.5000 mg | INTRAMUSCULAR | Status: DC | PRN

## 2014-08-05 MED ORDER — OXYCODONE-ACETAMINOPHEN 5-325 MG PO TABS: 2.0000 | ORAL_TABLET | ORAL | Status: DC | PRN

## 2014-08-05 MED ORDER — LACTATED RINGERS IV SOLN
INTRAVENOUS | Status: DC
  Administered 2014-08-05 (×2): via INTRAVENOUS

## 2014-08-05 MED ORDER — ACETAMINOPHEN 325 MG PO TABS: 650.0000 mg | ORAL_TABLET | ORAL | Status: DC | PRN

## 2014-08-05 MED ORDER — SIMETHICONE 80 MG PO CHEW: 80.0000 mg | CHEWABLE_TABLET | ORAL | Status: DC | PRN

## 2014-08-05 MED ORDER — SODIUM CHLORIDE 0.9 % IV SOLN: 250.0000 mL | INTRAVENOUS | Status: DC | PRN

## 2014-08-05 MED ORDER — ONDANSETRON HCL 4 MG PO TABS: 4.0000 mg | ORAL_TABLET | ORAL | Status: DC | PRN

## 2014-08-05 MED ORDER — PHENYLEPHRINE 40 MCG/ML (10ML) SYRINGE FOR IV PUSH (FOR BLOOD PRESSURE SUPPORT)
80.0000 ug | PREFILLED_SYRINGE | INTRAVENOUS | Status: DC | PRN
  Filled 2014-08-05: qty 2
  Filled 2014-08-05: qty 20

## 2014-08-05 MED ORDER — FENTANYL 2.5 MCG/ML BUPIVACAINE 1/10 % EPIDURAL INFUSION (WH - ANES)
14.0000 mL/h | INTRAMUSCULAR | Status: DC | PRN
  Administered 2014-08-05 (×3): 14 mL/h via EPIDURAL
  Filled 2014-08-05 (×2): qty 125

## 2014-08-05 MED ORDER — ONDANSETRON HCL 4 MG/2ML IJ SOLN: 4.0000 mg | Freq: Four times a day (QID) | INTRAMUSCULAR | Status: DC | PRN

## 2014-08-05 MED ORDER — OXYTOCIN 40 UNITS IN LACTATED RINGERS INFUSION - SIMPLE MED
62.5000 mL/h | INTRAVENOUS | Status: DC
  Administered 2014-08-05: 62.5 mL/h via INTRAVENOUS
  Filled 2014-08-05: qty 1000

## 2014-08-05 MED ORDER — FLEET ENEMA 7-19 GM/118ML RE ENEM: 1.0000 | ENEMA | Freq: Every day | RECTAL | Status: DC | PRN

## 2014-08-05 MED ORDER — OXYTOCIN BOLUS FROM INFUSION
500.0000 mL | INTRAVENOUS | Status: DC
  Administered 2014-08-05: 500 mL via INTRAVENOUS

## 2014-08-05 MED ORDER — LIDOCAINE HCL (PF) 1 % IJ SOLN
INTRAMUSCULAR | Status: DC | PRN
  Administered 2014-08-05 (×2): 5 mL
  Administered 2014-08-05: 3 mL

## 2014-08-05 MED ORDER — SENNOSIDES-DOCUSATE SODIUM 8.6-50 MG PO TABS
2.0000 | ORAL_TABLET | ORAL | Status: DC
  Administered 2014-08-06 (×2): 2 via ORAL
  Filled 2014-08-05 (×2): qty 2

## 2014-08-05 MED ORDER — LIDOCAINE HCL (PF) 1 % IJ SOLN
30.0000 mL | INTRAMUSCULAR | Status: DC | PRN
  Filled 2014-08-05: qty 30

## 2014-08-05 MED ORDER — EPHEDRINE 5 MG/ML INJ
10.0000 mg | INTRAVENOUS | Status: DC | PRN
  Filled 2014-08-05: qty 2

## 2014-08-05 MED ORDER — WITCH HAZEL-GLYCERIN EX PADS: 1.0000 "application " | MEDICATED_PAD | CUTANEOUS | Status: DC | PRN

## 2014-08-05 MED ORDER — OXYTOCIN 40 UNITS IN LACTATED RINGERS INFUSION - SIMPLE MED: 62.5000 mL/h | INTRAVENOUS | Status: DC | PRN

## 2014-08-05 MED ORDER — ZOLPIDEM TARTRATE 5 MG PO TABS: 5.0000 mg | ORAL_TABLET | Freq: Every evening | ORAL | Status: DC | PRN

## 2014-08-05 MED ORDER — OXYCODONE-ACETAMINOPHEN 5-325 MG PO TABS: 1.0000 | ORAL_TABLET | ORAL | Status: DC | PRN

## 2014-08-05 MED ORDER — SODIUM CHLORIDE 0.9 % IJ SOLN: 3.0000 mL | INTRAMUSCULAR | Status: DC | PRN

## 2014-08-05 MED ORDER — BISACODYL 10 MG RE SUPP: 10.0000 mg | Freq: Every day | RECTAL | Status: DC | PRN

## 2014-08-05 MED ORDER — SODIUM CHLORIDE 0.9 % IJ SOLN: 3.0000 mL | Freq: Two times a day (BID) | INTRAMUSCULAR | Status: DC

## 2014-08-05 MED ORDER — FLEET ENEMA 7-19 GM/118ML RE ENEM: 1.0000 | ENEMA | RECTAL | Status: DC | PRN

## 2014-08-05 MED ORDER — BENZOCAINE-MENTHOL 20-0.5 % EX AERO
1.0000 | INHALATION_SPRAY | CUTANEOUS | Status: DC | PRN
Start: 2014-08-05 — End: 2014-08-07
  Administered 2014-08-06: 1 via TOPICAL
  Filled 2014-08-05: qty 56

## 2014-08-05 MED ORDER — LANOLIN HYDROUS EX OINT: TOPICAL_OINTMENT | CUTANEOUS | Status: DC | PRN

## 2014-08-05 MED ORDER — ONDANSETRON HCL 4 MG/2ML IJ SOLN: 4.0000 mg | INTRAMUSCULAR | Status: DC | PRN

## 2014-08-05 MED ORDER — IBUPROFEN 600 MG PO TABS
600.0000 mg | ORAL_TABLET | Freq: Four times a day (QID) | ORAL | Status: DC
  Administered 2014-08-06 – 2014-08-07 (×7): 600 mg via ORAL
  Filled 2014-08-05 (×7): qty 1

## 2014-08-05 MED ORDER — PRENATAL MULTIVITAMIN CH
1.0000 | ORAL_TABLET | Freq: Every day | ORAL | Status: DC
  Administered 2014-08-06 – 2014-08-07 (×2): 1 via ORAL
  Filled 2014-08-05 (×2): qty 1

## 2014-08-05 MED ORDER — DIBUCAINE 1 % RE OINT: 1.0000 "application " | TOPICAL_OINTMENT | RECTAL | Status: DC | PRN

## 2014-08-05 MED ORDER — DIPHENHYDRAMINE HCL 25 MG PO CAPS: 25.0000 mg | ORAL_CAPSULE | Freq: Four times a day (QID) | ORAL | Status: DC | PRN

## 2014-08-05 NOTE — H&P (Signed)
Tina Kent is a 24 y.o. female G1 @ 39.1wks by LMP and confirmed by 7wk scan presenting for eval of ctx. Reports +bldy show but denies leaking. Her preg has been followed by the [facility] service and has been remarkable for 1) +THC in different occasions during preg 2) hx +chlam in 12/15 but more recent was neg 3) anemia   History OB History    Gravida Para Term Preterm AB TAB SAB Ectopic Multiple Living   1              Past Medical History  Diagnosis Date  . Medical history non-contributory    Past Surgical History  Procedure Laterality Date  . No past surgeries    . Wisdom tooth extraction     Family History: family history is not on file. Social History:  reports that she has quit smoking. Her smoking use included Cigarettes. She has never used smokeless tobacco. She reports that she does not drink alcohol or use illicit drugs.   Prenatal Transfer Tool  Maternal Diabetes: No Genetic Screening: Normal Maternal Ultrasounds/Referrals: Normal Fetal Ultrasounds or other Referrals:  None Maternal Substance Abuse:  Yes:  Type: Marijuana Significant Maternal Medications:  None Significant Maternal Lab Results:  Lab values include: Group B Strep negative Other Comments:  None  ROS  Dilation: 5.5 Effacement (%): 100 Station: -1, 0 Exam by:: Enis Slipper, RN Blood pressure 132/74, pulse 89, temperature 98.3 F (36.8 C), temperature source Oral, resp. rate 18, height 5\' 5"  (1.651 m), weight 71.85 kg (158 lb 6.4 oz), last menstrual period 11/04/2013, SpO2 99 %. Exam Physical Exam  Constitutional: She is oriented to person, place, and time. She appears well-developed.  HENT:  Head: Normocephalic.  Neck: Normal range of motion.  Cardiovascular: Normal rate.   Respiratory: Effort normal.  GI:  EFM +accels, no decels Reg ctx  Musculoskeletal: Normal range of motion.  Neurological: She is alert and oriented to person, place, and time.  Skin: Skin is warm and dry.   Psychiatric: She has a normal mood and affect. Her behavior is normal. Thought content normal.    CBC    Component Value Date/Time   WBC 10.3 08/05/2014 0520   WBC 11.2* 05/18/2014 0856   RBC 4.39 08/05/2014 0520   RBC 3.68* 05/18/2014 0856   HGB 10.4* 08/05/2014 0520   HCT 32.5* 08/05/2014 0520   PLT 180 08/05/2014 0520   MCV 74.0* 08/05/2014 0520   MCH 23.7* 08/05/2014 0520   MCH 26.4* 05/18/2014 0856   MCHC 32.0 08/05/2014 0520   MCHC 32.7 05/18/2014 0856   RDW 19.1* 08/05/2014 0520   RDW 14.2 05/18/2014 0856    Prenatal labs: ABO, Rh: --/--/B POS, B POS (06/25 0520) Antibody: NEG (06/25 0520) Rubella: 1.97 (12/07 1008) RPR: Non Reactive (04/07 0856)  HBsAg: NEGATIVE (12/07 1008)  HIV: NONREACTIVE (12/07 1008)  GBS: Negative (06/09 0000)   Assessment/Plan: IUP@ 39.1wks Early labor GBS neg  Admit to [facility] Expectant management Anticipate SVD UDS ordered   Cam Hai CNM 08/05/2014, 9:58 AM

## 2014-08-05 NOTE — Progress Notes (Signed)
Philipp Deputy CNM notified of pt's repeat sve and increased discomfort with ctxs. Will admit to [facility]

## 2014-08-05 NOTE — Progress Notes (Signed)
Philipp Deputy CNM notified of pt's admission and status. Aware of ctx pattern, sve, reactive FHR with variable. Pt may go home if no cervical change with reck. May have Ambien 5mg  at d/c if goes home

## 2014-08-05 NOTE — MAU Note (Signed)
Was seen here Friday and was 3cm. Ctxs have gotten stronger. Some bloody show. Sometimes panties feel wet

## 2014-08-05 NOTE — Anesthesia Procedure Notes (Signed)
Epidural Patient location during procedure: OB  Staffing Anesthesiologist: Angelie Kram Performed by: anesthesiologist   Preanesthetic Checklist Completed: patient identified, site marked, surgical consent, pre-op evaluation, timeout performed, IV checked, risks and benefits discussed and monitors and equipment checked  Epidural Patient position: sitting Prep: DuraPrep Patient monitoring: heart rate, continuous pulse ox and blood pressure Approach: right paramedian Location: L3-L4 Injection technique: LOR saline  Needle:  Needle type: Tuohy  Needle gauge: 17 G Needle length: 9 cm and 9 Needle insertion depth: 5 cm Catheter type: closed end flexible Catheter size: 20 Guage Catheter at skin depth: 10 cm Test dose: negative  Assessment Events: blood not aspirated, injection not painful, no injection resistance, negative IV test and no paresthesia  Additional Notes Patient identified. Risks/Benefits/Options discussed with patient including but not limited to bleeding, infection, nerve damage, paralysis, failed block, incomplete pain control, headache, blood pressure changes, nausea, vomiting, reactions to medication both or allergic, itching and postpartum back pain. Confirmed with bedside nurse the patient's most recent platelet count. Confirmed with patient that they are not currently taking any anticoagulation, have any bleeding history or any family history of bleeding disorders. Patient expressed understanding and wished to proceed. All questions were answered. Sterile technique was used throughout the entire procedure. Please see nursing notes for vital signs. Test dose was given through epidural needle and negative prior to continuing to dose epidural or start infusion. Warning signs of high block given to the patient including shortness of breath, tingling/numbness in hands, complete motor block, or any concerning symptoms with instructions to call for help. Patient was given  instructions on fall risk and not to get out of bed. All questions and concerns addressed with instructions to call with any issues.   

## 2014-08-05 NOTE — Anesthesia Preprocedure Evaluation (Signed)
Anesthesia Evaluation  Patient identified by MRN, date of birth, ID band Patient awake    Reviewed: Allergy & Precautions, H&P , NPO status , Patient's Chart, lab work & pertinent test results  History of Anesthesia Complications Negative for: history of anesthetic complications  Airway Mallampati: II  TM Distance: >3 FB Neck ROM: full    Dental no notable dental hx. (+) Teeth Intact   Pulmonary neg pulmonary ROS, former smoker,  breath sounds clear to auscultation  Pulmonary exam normal       Cardiovascular negative cardio ROS Normal cardiovascular examRhythm:regular Rate:Normal     Neuro/Psych negative neurological ROS  negative psych ROS   GI/Hepatic negative GI ROS, Neg liver ROS,   Endo/Other  negative endocrine ROS  Renal/GU negative Renal ROS  negative genitourinary   Musculoskeletal   Abdominal   Peds  Hematology negative hematology ROS (+)   Anesthesia Other Findings   Reproductive/Obstetrics (+) Pregnancy                             Anesthesia Physical Anesthesia Plan  ASA: II  Anesthesia Plan: Epidural   Post-op Pain Management:    Induction:   Airway Management Planned:   Additional Equipment:   Intra-op Plan:   Post-operative Plan:   Informed Consent: I have reviewed the patients History and Physical, chart, labs and discussed the procedure including the risks, benefits and alternatives for the proposed anesthesia with the patient or authorized representative who has indicated his/her understanding and acceptance.     Plan Discussed with:   Anesthesia Plan Comments:         Anesthesia Quick Evaluation  

## 2014-08-06 NOTE — Anesthesia Postprocedure Evaluation (Signed)
  Anesthesia Post-op Note  Patient: Tina Kent  Procedure(s) Performed: * No procedures listed *  Patient Location: PACU and Mother/Baby  Anesthesia Type:Epidural  Level of Consciousness: awake, alert  and oriented  Airway and Oxygen Therapy: Patient Spontanous Breathing  Post-op Pain: none  Post-op Assessment: Post-op Vital signs reviewed, Patient's Cardiovascular Status Stable, No headache, No backache, No residual numbness and No residual motor weakness  Post-op Vital Signs: Reviewed and stable  Complications: No apparent anesthesia complications

## 2014-08-06 NOTE — Progress Notes (Signed)
Post Partum Day 1 Subjective:  Tina Kent is a 24 y.o. G1P1001 [redacted]w[redacted]d s/p SVD.  No acute events overnight.  Pt denies problems with ambulating, voiding or po intake.  She denies nausea or vomiting.  Pain is well controlled.  Method of Feeding: breast  Objective: Blood pressure 100/62, pulse 102, temperature 98.4 F (36.9 C), temperature source Oral, resp. rate 20, height 5\' 5"  (1.651 m), weight 158 lb 6.4 oz (71.85 kg), last menstrual period 11/04/2013, SpO2 99 %, unknown if currently breastfeeding.  Physical Exam:  General: alert, cooperative and no distress Lochia:normal flow Chest: CTAB Heart: RRR no m/r/g Abdomen: +BS, soft, nontender,  Uterine Fundus: firm DVT Evaluation: No evidence of DVT seen on physical exam. Extremities: no edema   Recent Labs  08/05/14 0520  HGB 10.4*  HCT 32.5*    Assessment/Plan:  ASSESSMENT: Tina Kent is a 24 y.o. G1P1001 [redacted]w[redacted]d s/p SVD late last night  Plan for discharge tomorrow   LOS: 1 day   Pauline Pegues ROCIO 08/06/2014, 5:33 AM

## 2014-08-06 NOTE — Lactation Note (Signed)
This note was copied from the chart of Tina Kent. Lactation Consultation Note  Patient Name: Tina Vaudie Biagioni [id number]'[id number] Date: 08/06/2014 Reason for consult: Initial assessment Visited with Mom, baby 15 hrs old.  Baby sleeping on FOB's chest.  Offered assistance with a feeding, baby latched easily and well, without any discomfort.  Latch score 9.  Mom shown how to manually express colostrum.  Basic teaching done.  Brochure left in room.  Explained about IP and OP lactation services available to Mom.  Encouraged her to keep baby skin to skin, and feed baby on cue.  Follow up in am, and call for help prn.   Consult Status Consult Status: Follow-up Date: 08/07/14 Follow-up type: In-patient    Judee Clara 08/06/2014, 12:25 PM

## 2014-08-07 MED ORDER — NORETHINDRONE 0.35 MG PO TABS: 1.0000 | ORAL_TABLET | Freq: Every day | ORAL | Status: AC

## 2014-08-07 MED ORDER — OXYCODONE-ACETAMINOPHEN 2.5-325 MG PO TABS: 1.0000 | ORAL_TABLET | ORAL | Status: AC | PRN

## 2014-08-07 MED ORDER — IBUPROFEN 600 MG PO TABS: 600.0000 mg | ORAL_TABLET | Freq: Four times a day (QID) | ORAL | Status: AC

## 2014-08-07 NOTE — Discharge Summary (Signed)
Obstetric Discharge Summary Reason for Admission: onset of labor Prenatal Procedures: ultrasound Intrapartum Procedures: spontaneous vaginal delivery Postpartum Procedures: none Complications-Operative and Postpartum: none HEMOGLOBIN  Date Value Ref Range Status  08/05/2014 10.4* 12.0 - 15.0 g/dL Final   HCT  Date Value Ref Range Status  08/05/2014 32.5* 36.0 - 46.0 % Final    Physical Exam:  General: alert, cooperative, appears stated age and no distress Lochia: appropriate Uterine Fundus: firm Incision: n/a DVT Evaluation: No evidence of DVT seen on physical exam. Negative Homan's sign. No cords or calf tenderness.  Discharge Diagnoses: Term Pregnancy-delivered  Discharge Information: Date: 08/07/2014 Activity: pelvic rest Diet: routine Medications: PNV, Ibuprofen and Percocet Condition: stable and improved Instructions: refer to practice specific booklet Discharge to: home   Newborn Data: Live born female  Birth Weight: 8 lb 0.4 oz (3640 g) APGAR: 9, 9  Home with mother.  Tina Kent, Tina Kent 08/07/2014, 6:49 AM

## 2014-08-07 NOTE — Progress Notes (Signed)
UR chart review completed.  

## 2014-08-07 NOTE — Lactation Note (Signed)
This note was copied from the chart of Boy Norvelle Kulaga. Lactation Consultation Note  Patient Name: Boy Ilyssa Moleski [id number]'I Date: 08/07/2014 Reason for consult: Follow-up assessment   With this mom of a term baby, now 45 hours old. The baby was at 7% weight loss at about 26 hours of life. He has been cluster feeding, and mom has easily expressed colostrum. The baby's last stool was pasty/brown. He has had 5 stools in 36 hours. Breast care and lactation services after discharge  reviewed  with mom. Mom knows to call for questions/concerns.   Maternal Data    Feeding Feeding Type: Breast Fed Length of feed: 10 min  LATCH Score/Interventions Latch: Grasps breast easily, tongue down, lips flanged, rhythmical sucking. Intervention(s): Assist with latch  Audible Swallowing: A few with stimulation Intervention(s): Skin to skin;Hand expression  Type of Nipple: Everted at rest and after stimulation  Comfort (Breast/Nipple): Soft / non-tender     Hold (Positioning): Assistance needed to correctly position infant at breast and maintain latch. (showed mom cross cradle as opposed to cradle, to obtain a deeper latch) Intervention(s): Breastfeeding basics reviewed;Support Pillows;Position options;Skin to skin  LATCH Score: 8  Lactation Tools Discussed/Used     Consult Status Consult Status: Complete Follow-up type: Call as needed    Alfred Levins 08/07/2014, 11:18 AM

## 2014-08-09 ENCOUNTER — Encounter: Payer: Medicaid Other | Admitting: Obstetrics and Gynecology

## 2014-09-27 ENCOUNTER — Ambulatory Visit: Payer: Medicaid Other | Admitting: Advanced Practice Midwife

## 2014-09-27 ENCOUNTER — Encounter: Payer: Self-pay | Admitting: *Deleted
